# Patient Record
Sex: Female | Born: 1985 | Race: White | Hispanic: No | Marital: Married | Smoking: Never smoker
Health system: Southern US, Community
[De-identification: ages and names within clinical notes are randomized; demographics above are authoritative.]

## PROBLEM LIST (undated history)

## (undated) ENCOUNTER — Inpatient Hospital Stay (HOSPITAL_COMMUNITY): Payer: Self-pay

## (undated) DIAGNOSIS — I10 Essential (primary) hypertension: Secondary | ICD-10-CM

## (undated) DIAGNOSIS — E669 Obesity, unspecified: Secondary | ICD-10-CM

## (undated) HISTORY — PX: TONSILLECTOMY: SHX5217

---

## 2012-04-14 ENCOUNTER — Ambulatory Visit: Payer: Self-pay | Admitting: Obstetrics and Gynecology

## 2013-09-27 IMAGING — US ULTRASOUND RIGHT BREAST
1 series · 8 of 8 positions shown · non-contrast
Comparison: none

REASON FOR EXAM: RT BR LESION 3 OCLOCK
COMMENTS:

[Series 1: ultrasound right breast · 0.09mm/px · 8 of 8 slices shown]
[im 1/8]
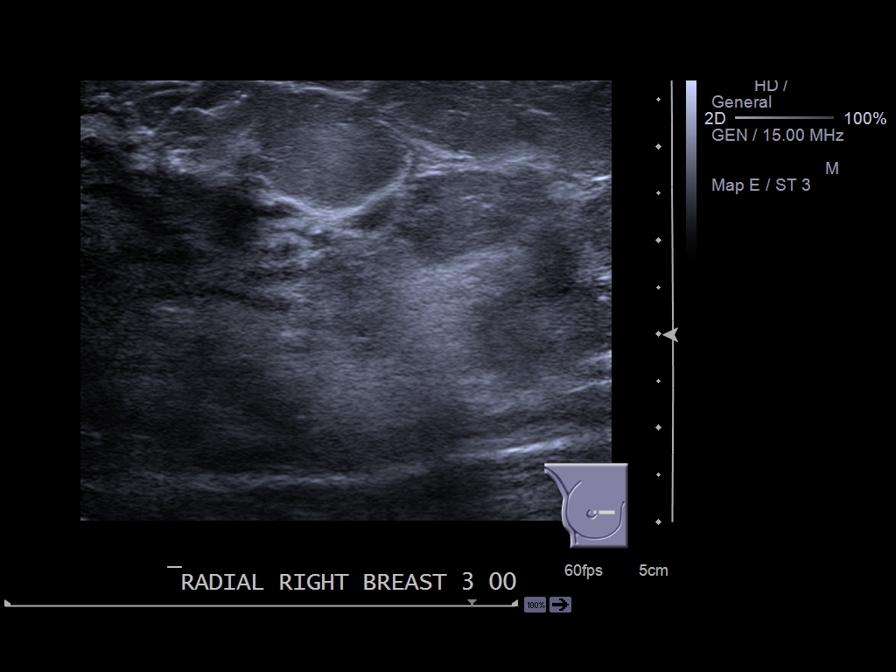
[im 2/8]
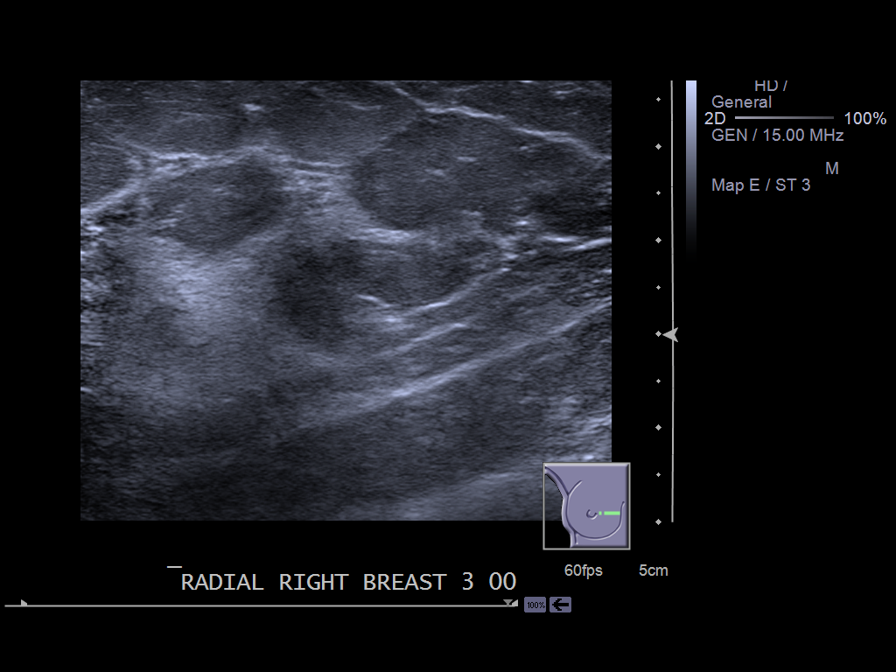
[im 3/8]
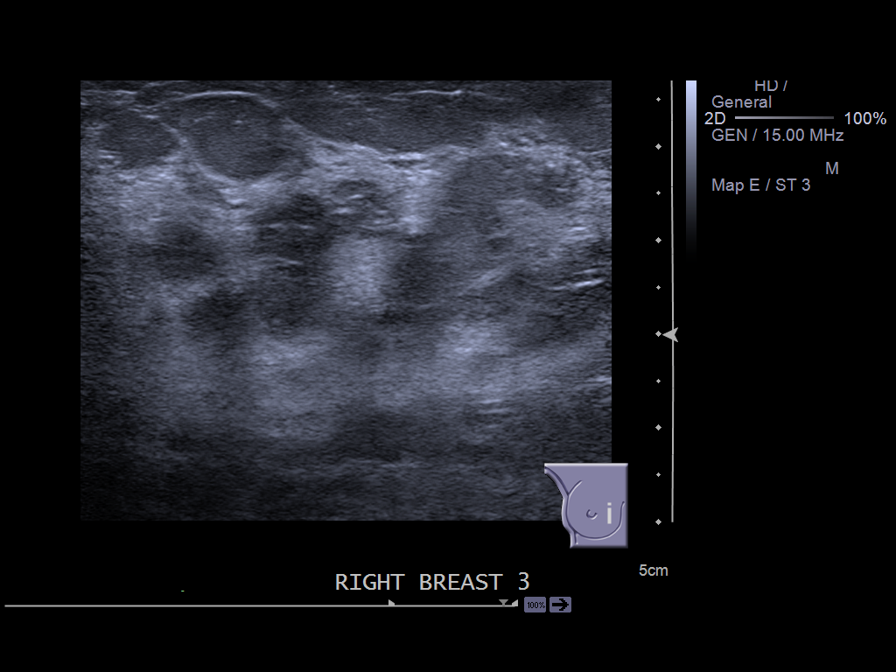
[im 4/8]
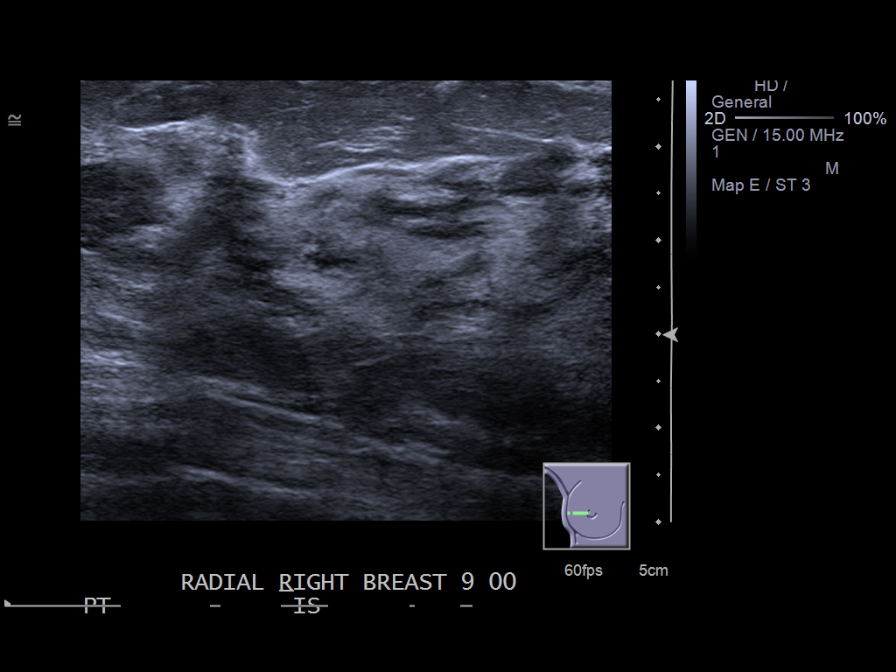
[im 5/8]
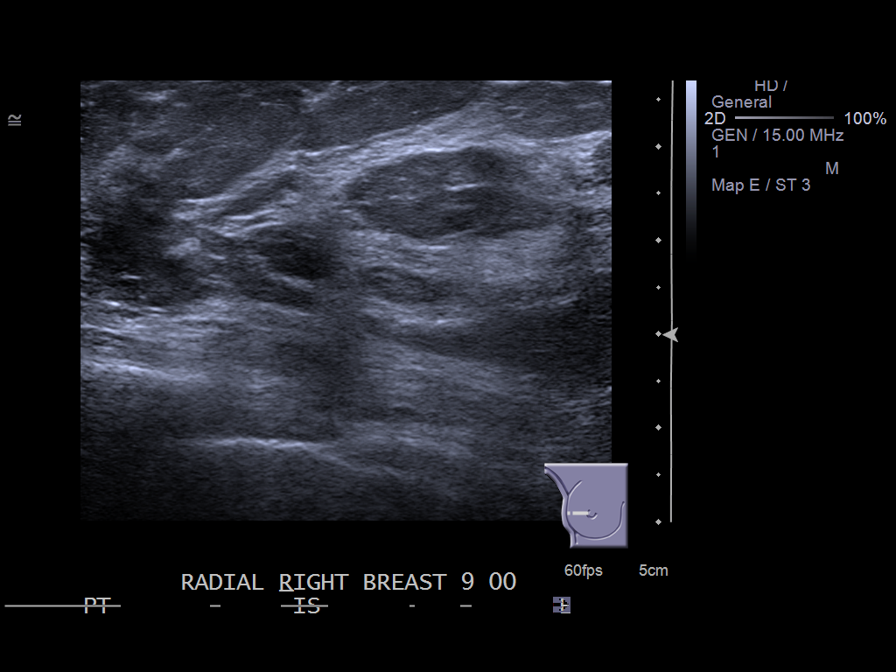
[im 6/8]
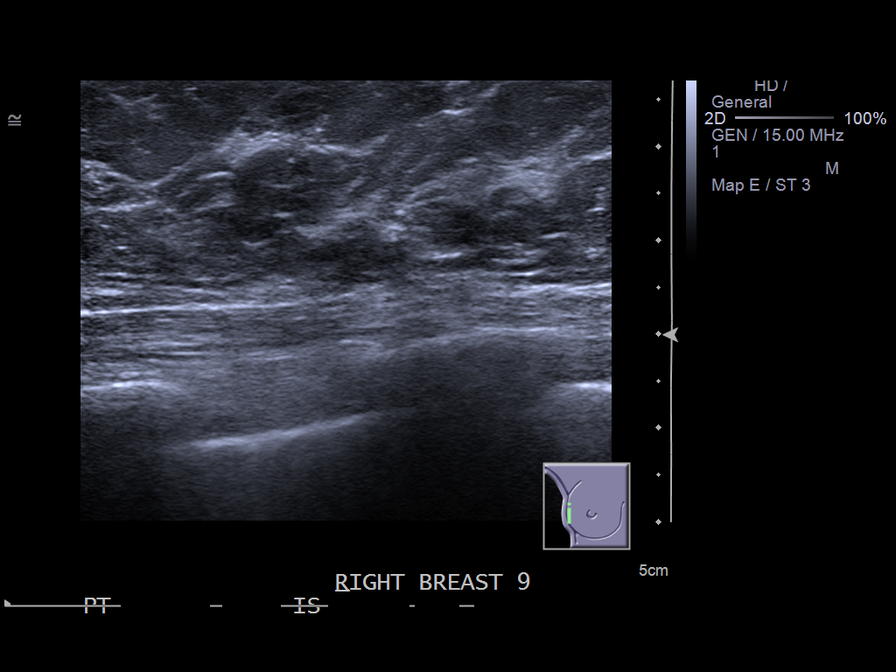
[im 7/8]
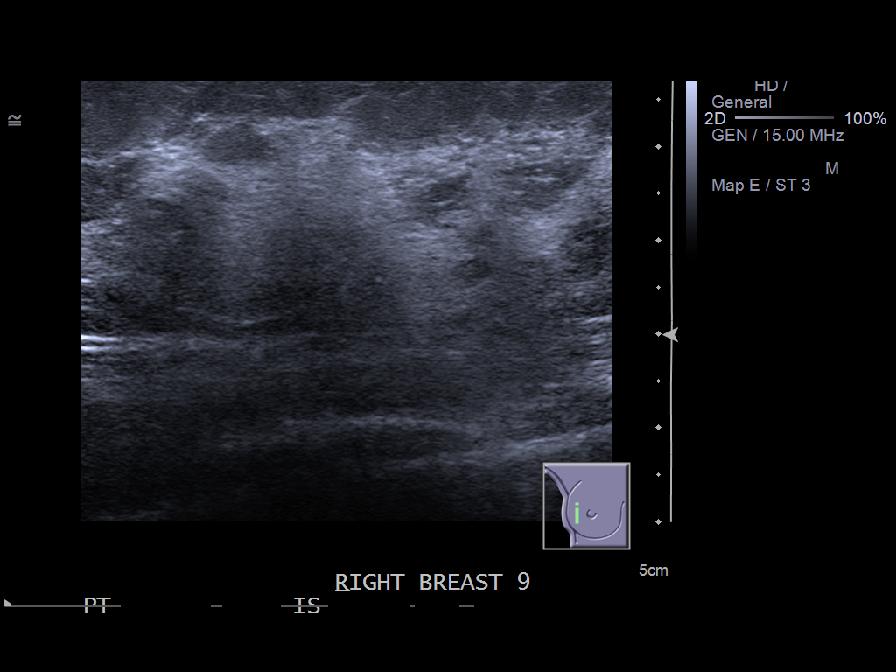
[im 8/8]
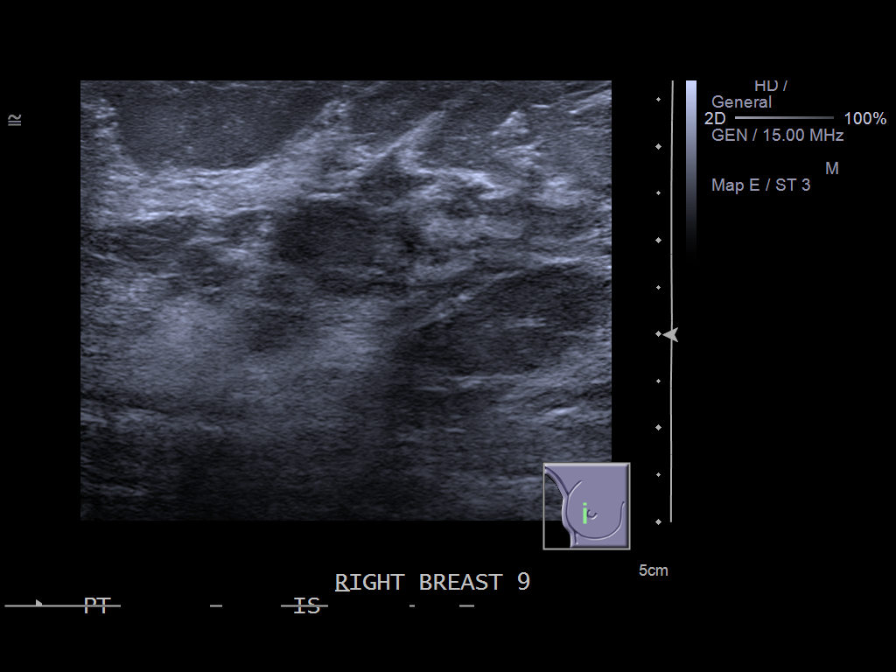

[8 of 8 positions shown; findings below may reference images not displayed]

PROCEDURE:     US  - US BREAST RIGHT  - April 14, 2012 [DATE]

RESULT:     Comparison: None.

Technique and Findings:
Multiple grayscale and color Doppler images were obtained of the right
breast at [DATE] as well as [DATE]. The order stated lesion at [DATE]. However,
the patient's stated the ordering physician described palpable abnormality
at [DATE]. No mass or suspicious shadowing was identified. There is dense
fibroglandular tissue present.
IMPRESSION: BI-RADS 1. Negative.

There is no sonographic evidence of malignancy. Recommend further evaluation
and management of the reported palpable abnormality be based on the grounds.
If this area remains clinically suspicious, surgical consultation would be
recommended.

[REDACTED]

## 2014-10-18 ENCOUNTER — Ambulatory Visit: Payer: Self-pay | Admitting: Physician Assistant

## 2015-12-19 DIAGNOSIS — E669 Obesity, unspecified: Secondary | ICD-10-CM | POA: Insufficient documentation

## 2016-04-01 IMAGING — US US PELV - US TRANSVAGINAL
1 series · 14 of 25 positions shown · non-contrast
Comparison: None

CLINICAL DATA: RIGHT lower quadrant pain. Initial encounter. Acute
onset of pain yesterday.



[Series 1: us pelv - us transvaginal · 0.22mm/px · 14 of 97 slices shown]
[im 1/97]
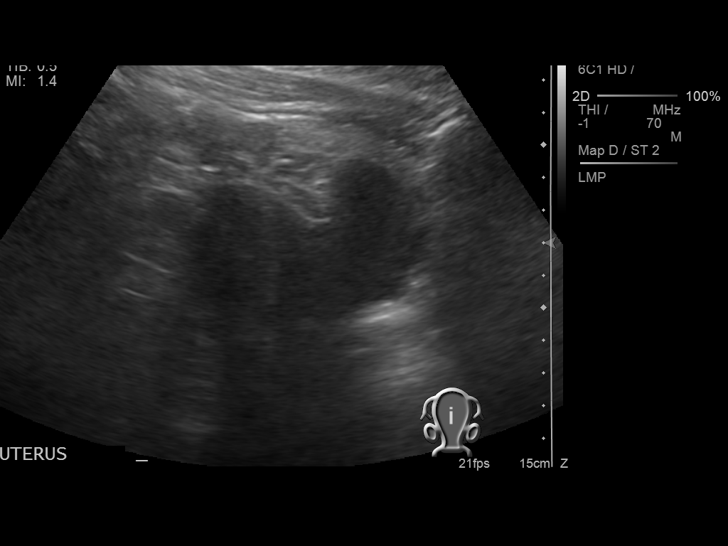
[im 9/97]
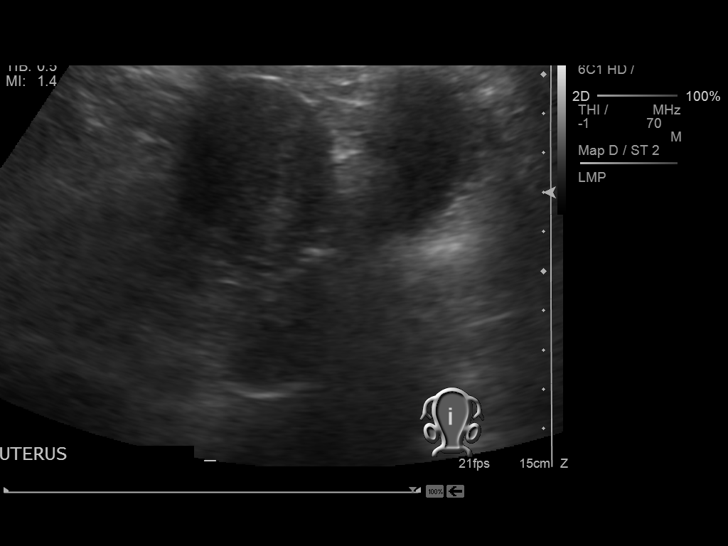
[im 17/97]
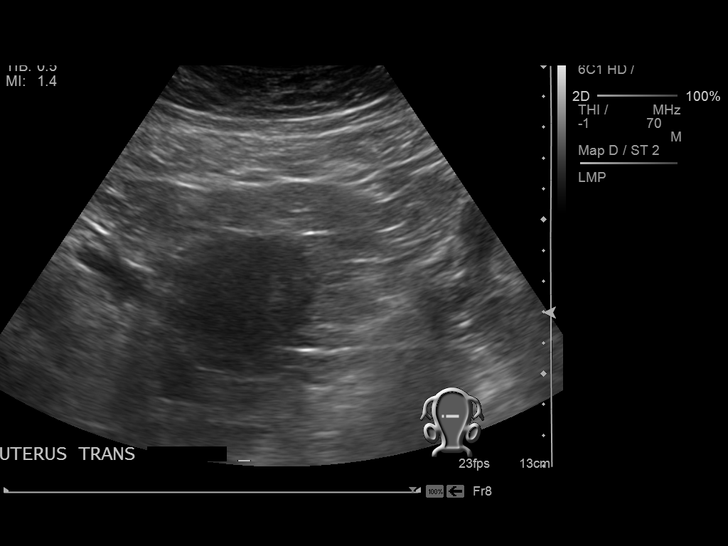
[im 25/97]
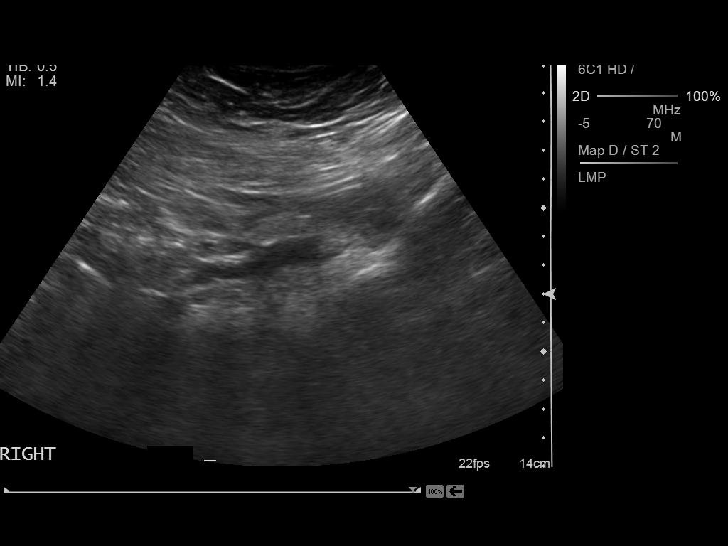
[im 33/97]
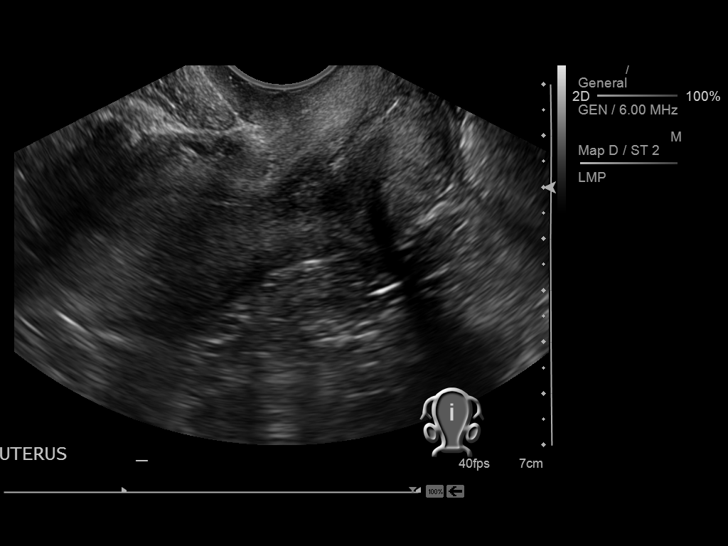
[im 37/97]
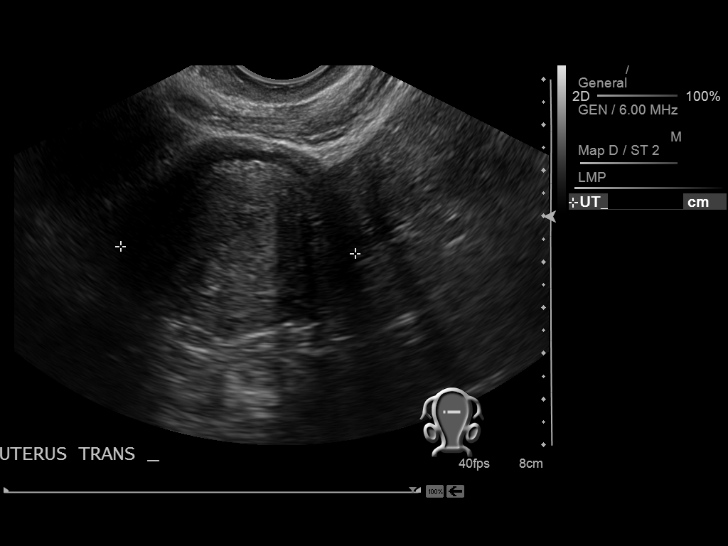
[im 45/97]
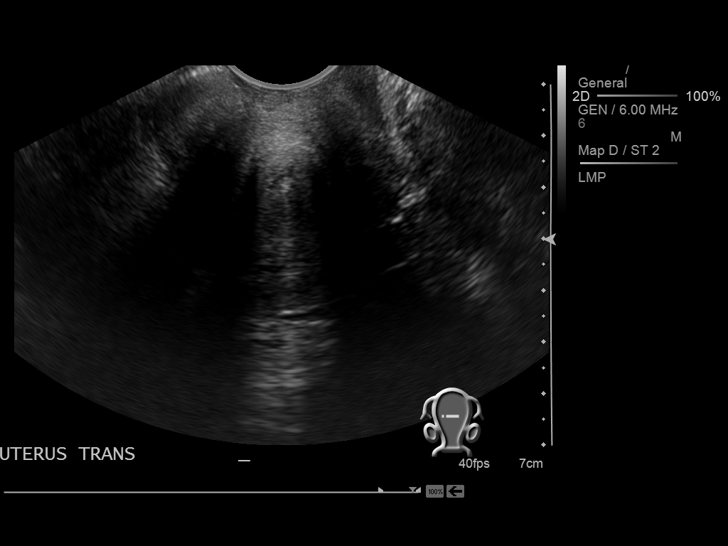
[im 53/97]
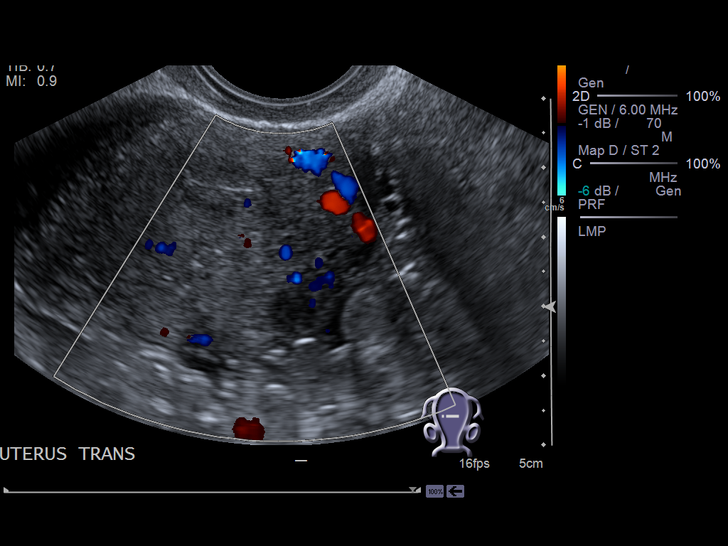
[im 61/97]
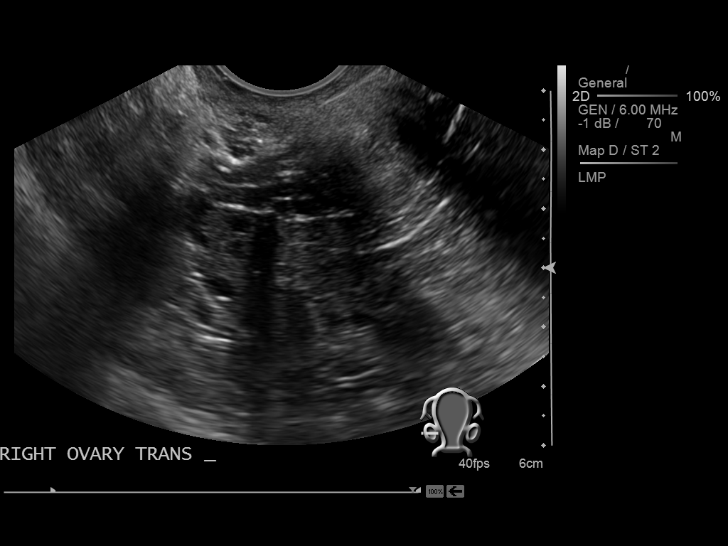
[im 65/97]
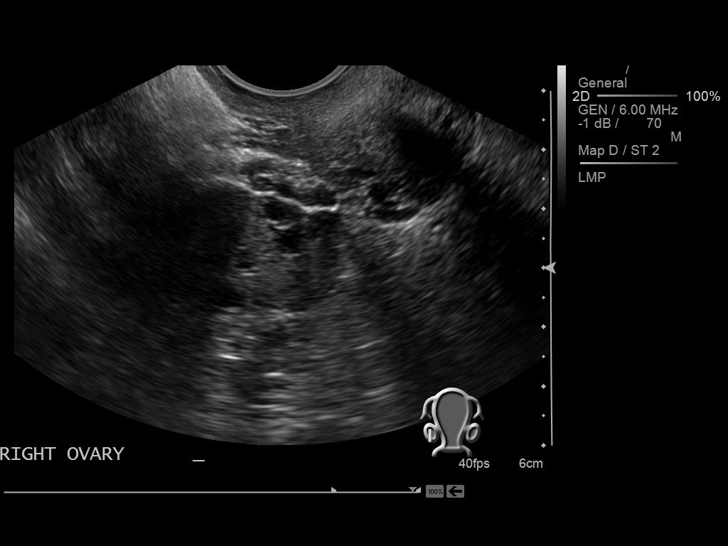
[im 73/97]
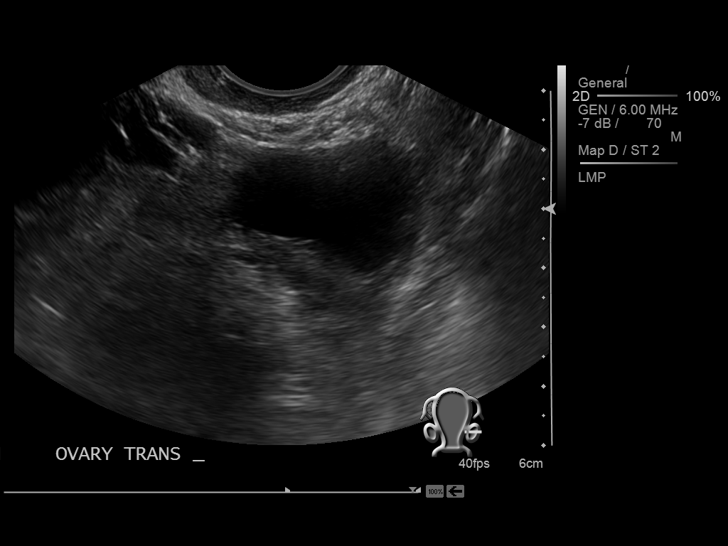
[im 81/97]
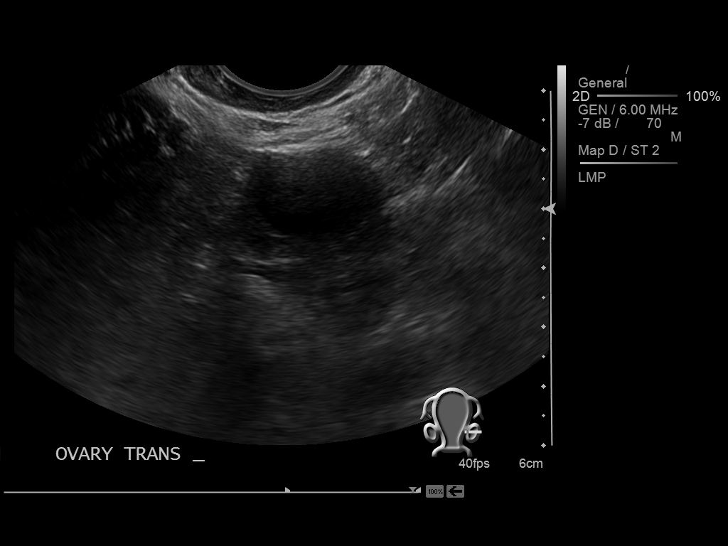
[im 89/97]
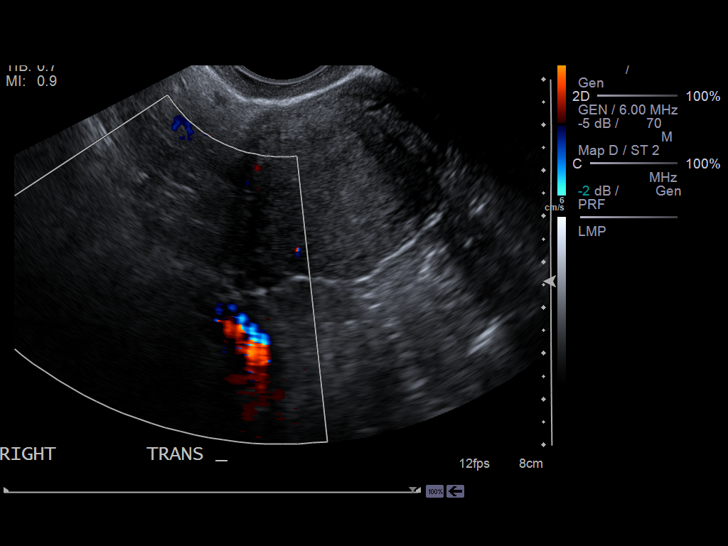
[im 97/97]
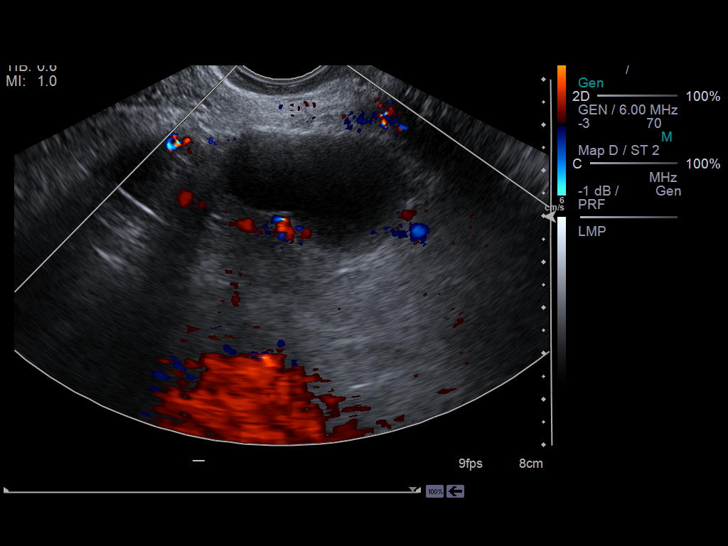

[14 of 25 positions shown; findings below may reference images not displayed]

FINDINGS: Uterus

Measurements: 83 mm x 38 mm x 52 mm. Small anterior fundal fibroid
measuring 8 mm x 6 mm x 8 mm in the LEFT uterine fundus.

Endometrium

Thickness: 3 mm common normal..  No focal abnormality visualized.

Right ovary

Measurements: 33 mm x 15 mm x 23 mm.. Normal appearance/no adnexal
mass.

Left ovary

Measurements: 45 mm x 33 mm x 36 mm.. Normal physiologic appearance.
Simple ovarian cyst measuring 38 mm.

Other findings

Trace free fluid, physiologic.
IMPRESSION: 1. No acute abnormality.
2. Subcentimeter LEFT fundal fibroid.
3. Simple LEFT ovarian cyst.

## 2017-04-28 ENCOUNTER — Encounter: Payer: Self-pay | Admitting: Advanced Practice Midwife

## 2017-04-28 ENCOUNTER — Ambulatory Visit (INDEPENDENT_AMBULATORY_CARE_PROVIDER_SITE_OTHER): Payer: BC Managed Care – PPO | Admitting: Advanced Practice Midwife

## 2017-04-28 VITALS — BP 110/70 | HR 79 | Ht 70.0 in | Wt 262.0 lb

## 2017-04-28 DIAGNOSIS — Z3046 Encounter for surveillance of implantable subdermal contraceptive: Secondary | ICD-10-CM

## 2017-04-28 DIAGNOSIS — Z01419 Encounter for gynecological examination (general) (routine) without abnormal findings: Secondary | ICD-10-CM

## 2017-04-28 DIAGNOSIS — Z0000 Encounter for general adult medical examination without abnormal findings: Secondary | ICD-10-CM

## 2017-04-28 NOTE — Progress Notes (Signed)
Patient ID: Jody Smith, female   DOB: 1986/07/15, 31 y.o.   MRN: [id number]     Gynecology Annual Exam  PCP: Raynelle Bring  Chief Complaint:  Chief Complaint  Patient presents with  . Gynecologic Exam    getting married next month  . Contraception    Nexplanon removal    History of Present Illness: Patient is a 31 y.o. G1P0010 presents for annual exam. The patient has no complaints today. She is requesting removal of her Nexplanon due to desired future pregnancy. Discussion of preconception and fertility. Discussion of her current medications and safety during pregnancy. Recommendations given for alternative therapy to aid sleep.   LMP: Patient's last menstrual period was 04/28/2017. Average Interval: regular, 28 days Duration of flow: 5 days Heavy Menses: yes Clots: no Intermenstrual Bleeding: no Postcoital Bleeding: no Dysmenorrhea: no  The patient is sexually active. She currently uses Nexplanon for contraception. She denies dyspareunia.  The patient does not perform self breast exams.  There is no notable family history of breast or ovarian cancer in her family.  The patient wears seatbelts: yes.   The patient has regular exercise: yes.  She admits to healthy diet and adequate hydration.  The patient denies current symptoms of depression.    Review of Systems: Review of Systems  Constitutional: Negative.   HENT: Negative.   Eyes: Negative.   Respiratory: Negative.   Cardiovascular: Negative.   Gastrointestinal: Negative.   Genitourinary: Negative.   Musculoskeletal: Negative.   Skin: Negative.   Neurological: Negative.   Endo/Heme/Allergies: Negative.   Psychiatric/Behavioral: Negative.     Past Medical History:  History reviewed. No pertinent past medical history.  Past Surgical History:  History reviewed. No pertinent surgical history.  Gynecologic History:  Patient's last menstrual period was 04/28/2017. Contraception: Nexplanon Last Pap:  1 year ago Results were: no abnormalities   Obstetric History: G1P0010  Family History:  History reviewed. No pertinent family history.  Social History:  Social History   Social History  . Marital status: Single    Spouse name: N/A  . Number of children: N/A  . Years of education: N/A   Occupational History  . Not on file.   Social History Main Topics  . Smoking status: Never Smoker  . Smokeless tobacco: Never Used  . Alcohol use Yes     Comment: social  . Drug use: No  . Sexual activity: Yes    Partners: Male    Birth control/ protection: Implant   Other Topics Concern  . Not on file   Social History Narrative  . No narrative on file    Allergies:  No Known Allergies  Medications: Prior to Admission medications   Medication Sig Start Date End Date Taking? Authorizing Provider  clindamycin (CLINDAGEL) 1 % gel Apply topically. 10/07/16 10/07/17 Yes [provider]  topiramate (TOPAMAX) 50 MG tablet Take by mouth. 10/07/16 10/07/17 Yes [provider]  traZODone (DESYREL) 50 MG tablet TAKE 1-2 TABLETS AT BEDTIME AS NEEDED FOR SLEEP 04/18/17  Yes [provider]    Physical Exam Vitals: Blood pressure 110/70, pulse 79, height 5\' 10"  (1.778 m), weight 262 lb (118.8 kg), last menstrual period 04/28/2017.  General: NAD HEENT: normocephalic, anicteric Thyroid: no enlargement, no palpable nodules Pulmonary: No increased work of breathing, CTAB Cardiovascular: RRR, distal pulses 2+ Breast: Breast symmetrical, no tenderness, no palpable nodules or masses, no skin or nipple retraction present, no nipple discharge.  No axillary or supraclavicular lymphadenopathy. Abdomen: NABS,  soft, non-tender, non-distended.  Umbilicus without lesions.  No hepatomegaly, splenomegaly or masses palpable. No evidence of hernia  Genitourinary:  External: Normal external female genitalia.  Normal urethral meatus, normal  Bartholin's and Skene's glands.    Vagina:  Normal vaginal mucosa, no evidence of prolapse.    Cervix: Grossly normal in appearance, no bleeding, no CMT  Uterus: Non-enlarged, mobile, normal contour.    Adnexa: ovaries non-enlarged, no adnexal masses  Rectal: deferred  Lymphatic: no evidence of inguinal lymphadenopathy Extremities: no edema, erythema, or tenderness Neurologic: Grossly intact Psychiatric: mood appropriate, affect full   Assessment: 31 y.o. G1P0010 routine annual exam  Plan: Problem List Items Addressed This Visit    None      1) STI screening was offered and declined  2) ASCCP guidelines and rational discussed.  Patient opts for every 3 years screening interval  3) Contraception - Patient plans for future pregnancy  4) Routine healthcare maintenance including cholesterol, diabetes screening discussed managed by PCP   5) Preconception healthy lifestyle, diet, exercise, folic acid supplement, safe meds  6) Follow up 1 year for routine annual exam  Jody Smith, CNM     GYNECOLOGY PROCEDURE NOTE  Nexplanon removal discussed in detail.  Risks of infection, bleeding, nerve injury all reviewed.  Patient understands risks and desires to proceed.  Verbal consent obtained.  Patient is certain she wants the Nexplanon removed.  All questions answered.  Procedure: Patient placed in dorsal supine with left arm above head, elbow flexed at 90 degrees, arm resting on examination table.  Nexplanon identified without problems.  Betadine scrub x2.  1 ml of 1% lidocaine injected under Nexplanon device without problems.  Sterile gloves applied.  Small 0.5cm incision made at distal tip of Nexplanon device with 11 blade scalpel.  Nexplanon brought to incision and grasped with a small kelly clamp.  Nexplanon removed intact without problems.  Pressure applied to incision.  Hemostasis obtained.  Steri-strips applied, followed by bandage and compression dressing.  Patient tolerated procedure well.  No  complications.   Assessment: 31 y.o. year old female now s/p uncomplicated Nexplanon removal.  Plan: 1.  Patient given post procedure precautions and asked to call for fever, chills, redness or drainage from her incision, bleeding from incision.  She understands she will likely have a small bruise near site of removal and can remove bandage tomorrow and steri-strips in approximately 1 week.  2) Contraception: plans future pregnancy  J2001 for lidocaine block, [id number] for nexplanon removal  Jody MallJane Ramzy Smith, CNM

## 2017-07-23 ENCOUNTER — Encounter: Payer: BC Managed Care – PPO | Admitting: Advanced Practice Midwife

## 2017-08-18 ENCOUNTER — Telehealth: Payer: Self-pay

## 2017-08-18 NOTE — Telephone Encounter (Signed)
U/s tech from [facility] in 4D called after hour nurse on 08/14/17 at 5:22pm stating they could not find the baby and wanted to talk to on call.  After hour nurse connected caller c on call.  I left msg for pt that I was calling to f/u c her about this (there is no documentation in chart).  [phone]

## 2017-08-19 NOTE — Telephone Encounter (Signed)
Yes, I called the patient. Turns out she is a [employer] patient. I advised her to call her provider about the pregnancy. Was suppose to be 9 weeks-had an empty gestational sac.

## 2017-09-20 DIAGNOSIS — I10 Essential (primary) hypertension: Secondary | ICD-10-CM | POA: Insufficient documentation

## 2017-09-20 DIAGNOSIS — G4709 Other insomnia: Secondary | ICD-10-CM | POA: Insufficient documentation

## 2018-08-08 ENCOUNTER — Other Ambulatory Visit: Payer: Self-pay | Admitting: Internal Medicine

## 2018-08-08 DIAGNOSIS — R112 Nausea with vomiting, unspecified: Secondary | ICD-10-CM

## 2018-08-08 DIAGNOSIS — R1084 Generalized abdominal pain: Secondary | ICD-10-CM

## 2018-08-10 ENCOUNTER — Ambulatory Visit: Payer: Self-pay

## 2018-08-11 ENCOUNTER — Ambulatory Visit
Admission: RE | Admit: 2018-08-11 | Discharge: 2018-08-11 | Disposition: A | Discharge status: Home / Self Care | Payer: BC Managed Care – PPO | Source: Ambulatory Visit | Attending: Internal Medicine | Admitting: Internal Medicine

## 2018-08-11 ENCOUNTER — Other Ambulatory Visit
Admission: RE | Admit: 2018-08-11 | Discharge: 2018-08-11 | Disposition: A | Discharge status: Home / Self Care | Payer: BC Managed Care – PPO | Source: Ambulatory Visit | Attending: Internal Medicine | Admitting: Internal Medicine

## 2018-08-11 DIAGNOSIS — R1084 Generalized abdominal pain: Secondary | ICD-10-CM | POA: Diagnosis present

## 2018-08-11 DIAGNOSIS — R112 Nausea with vomiting, unspecified: Secondary | ICD-10-CM | POA: Insufficient documentation

## 2018-08-11 DIAGNOSIS — K591 Functional diarrhea: Secondary | ICD-10-CM | POA: Insufficient documentation

## 2018-08-11 LAB — GASTROINTESTINAL PANEL BY PCR, STOOL (REPLACES STOOL CULTURE)
Adenovirus F40/41: NOT DETECTED
Astrovirus: NOT DETECTED
CRYPTOSPORIDIUM: NOT DETECTED
Campylobacter species: NOT DETECTED
Cyclospora cayetanensis: NOT DETECTED
ENTEROPATHOGENIC E COLI (EPEC): NOT DETECTED
Entamoeba histolytica: NOT DETECTED
Enteroaggregative E coli (EAEC): NOT DETECTED
Enterotoxigenic E coli (ETEC): NOT DETECTED
Giardia lamblia: NOT DETECTED
Norovirus GI/GII: NOT DETECTED
Plesimonas shigelloides: NOT DETECTED
ROTAVIRUS A: NOT DETECTED
Salmonella species: NOT DETECTED
Sapovirus (I, II, IV, and V): NOT DETECTED
Shiga like toxin producing E coli (STEC): NOT DETECTED
Shigella/Enteroinvasive E coli (EIEC): NOT DETECTED
Vibrio cholerae: NOT DETECTED
Vibrio species: NOT DETECTED
YERSINIA ENTEROCOLITICA: NOT DETECTED

## 2018-08-11 LAB — C DIFFICILE QUICK SCREEN W PCR REFLEX
C Diff antigen: NEGATIVE
C Diff interpretation: NOT DETECTED
C Diff toxin: NEGATIVE

## 2018-08-12 LAB — CALPROTECTIN, FECAL: CALPROTECTIN, FECAL: 30 ug/g (ref 0–120)

## 2018-09-07 ENCOUNTER — Ambulatory Visit: Payer: BC Managed Care – PPO | Admitting: Dietician

## 2018-09-12 ENCOUNTER — Encounter: Payer: Self-pay | Admitting: Dietician

## 2018-09-12 ENCOUNTER — Encounter: Payer: BC Managed Care – PPO | Attending: Internal Medicine | Admitting: Dietician

## 2018-09-12 VITALS — Ht 70.0 in | Wt 304.8 lb

## 2018-09-12 DIAGNOSIS — I10 Essential (primary) hypertension: Secondary | ICD-10-CM | POA: Diagnosis not present

## 2018-09-12 DIAGNOSIS — Z6841 Body Mass Index (BMI) 40.0 and over, adult: Secondary | ICD-10-CM

## 2018-09-12 NOTE — Progress Notes (Signed)
Medical Nutrition Therapy: Visit start time: 1445  end time: 1550  Assessment:  Diagnosis: obesity, HTN Past medical history: GI reflux, insomnia Psychosocial issues/ stress concerns: patient reports moderately-high stress level; works as Editor, commissioning full time.   Preferred learning method:  . Hands-on   Current weight: 304.8lbs Height: 5'10" Medications, supplements: reconciled list in medical record  Progress and evaluation: Patient reports significant weight gain over the past year; had miscarriage at about [redacted] weeks gestation last December followed by depression which led to overeating. She wants to lose weight to improve fertility as well as back pain. Tried keto diet and liked it but has regained weight, has now started on a low calorie plan and has lost about 12 lbs so far. States she is a picky eater, does not like some vegetables such as tomatoes, peppers, carrots, and most fruits other than apples, oranges and pineapple.    Physical activity: none at this time  Dietary Intake:  Usual eating pattern includes 3 meals and 1-2 snacks per day. Dining out frequency: 3-4 meals per week.  Breakfast: 2pkg oatmeal or granola and coffee Snack: none Lunch: 6-in sub or wrap sandwich Snack: sometimes chips, usually none when at work  Supper:5:30-6:30-- larger meal -- 1/19 brown rice with shrimp, sugar free sauce; 1/20 air-fried chicken wings; does not like spicy foods.  Snack: 9-10pm sweets or chips Beverages: trying to increase water (doesn't like water, forces self to drink), flavored waters, has stopped sodas including diet, and fruit drinks/ juices  Nutrition Care Education: Topics covered: weight control, hypertension Basic nutrition: basic food groups, appropriate nutrient balance, appropriate meal and snack schedule, general nutrition guidelines    Weight control: benefits of weight control, healthy (ie Mediterranean) vs unhealthy (ie keto) diet patterns, choosing low fat  and low sugar foods, portion control, role of physical activity, benefits of tracking food intake Hypertension: goal for sodium intake and food label reading for sodium; encouraged increased vegetable and fruit intake, and exercise   Nutritional Diagnosis:  [place]-2.1 Inpaired nutrition utilization As related to hypertension.  As evidenced by MD diagnosis and patient report. [place]-3.3 Overweight/obesity As related to excess calories and inactivity.  As evidenced by patient with current BMI of 43.7, and patient report of diet history and physical activity.  Intervention:   Patient has made some significant diet changes for weight loss and is motivated to continue.  Completed instruction as noted above.  Set goals for further diet modification, with direction from patient.  Education Materials given:  . Plate Planner with food lists . Sample meal pattern/ menus . Mediterranean Diet ([place]) . Goals/ instructions   Learner/ who was taught:  . Patient   Level of understanding: Marland Kitchen Verbalizes/ demonstrates competency  Demonstrated degree of understanding via:   Teach back Learning barriers: . None   Willingness to learn/ readiness for change: . Eager, change in progress   Monitoring and Evaluation:  Dietary intake, exercise, HTN, and body weight      follow up: 10/27/18

## 2018-09-12 NOTE — Patient Instructions (Signed)
   Keep up healthy changes by limiting regular juices and sodas, lean proteins.   OK to choose some healthy frozen meals, ideally keep sodium to about 600mg  average.   Use smaller portions of butter and other fats.   Start some regular light exercise, increase as strength and stamina increases.   Control portions of starchy foods and eat generous portions of low-carb veggies.

## 2018-10-12 ENCOUNTER — Inpatient Hospital Stay (HOSPITAL_COMMUNITY)
Admission: AD | Admit: 2018-10-12 | Discharge: 2018-10-12 | Disposition: A | Discharge status: Home / Self Care | Payer: BC Managed Care – PPO | Attending: Obstetrics & Gynecology | Admitting: Obstetrics & Gynecology

## 2018-10-12 ENCOUNTER — Encounter (HOSPITAL_COMMUNITY): Payer: Self-pay | Admitting: *Deleted

## 2018-10-12 ENCOUNTER — Other Ambulatory Visit: Payer: Self-pay

## 2018-10-12 DIAGNOSIS — O00109 Unspecified tubal pregnancy without intrauterine pregnancy: Secondary | ICD-10-CM | POA: Insufficient documentation

## 2018-10-12 LAB — COMPREHENSIVE METABOLIC PANEL
ALT: 14 U/L (ref 0–44)
AST: 13 U/L — ABNORMAL LOW (ref 15–41)
Albumin: 4 g/dL (ref 3.5–5.0)
Alkaline Phosphatase: 61 U/L (ref 38–126)
Anion gap: 10 (ref 5–15)
BUN: 11 mg/dL (ref 6–20)
CO2: 23 mmol/L (ref 22–32)
CREATININE: 0.78 mg/dL (ref 0.44–1.00)
Calcium: 8.6 mg/dL — ABNORMAL LOW (ref 8.9–10.3)
Chloride: 102 mmol/L (ref 98–111)
GFR calc non Af Amer: >60 mL/min (ref >60)
Glucose, Bld: 81 mg/dL (ref 70–99)
Potassium: 3.7 mmol/L (ref 3.5–5.1)
SODIUM: 135 mmol/L (ref 135–145)
Total Bilirubin: 0.5 mg/dL (ref 0.3–1.2)
Total Protein: 7.8 g/dL (ref 6.5–8.1)

## 2018-10-12 LAB — CBC WITH DIFFERENTIAL/PLATELET
Basophils Absolute: 0 10*3/uL (ref 0.0–0.1)
Basophils Relative: 0 %
EOS ABS: 0 10*3/uL (ref 0.0–0.5)
EOS PCT: 0 %
HCT: 37.9 % (ref 36.0–46.0)
HEMOGLOBIN: 12.3 g/dL (ref 12.0–15.0)
Lymphocytes Relative: 24 %
Lymphs Abs: 2.1 10*3/uL (ref 0.7–4.0)
MCH: 28.9 pg (ref 26.0–34.0)
MCHC: 32.5 g/dL (ref 30.0–36.0)
MCV: 89 fL (ref 80.0–100.0)
Monocytes Absolute: 0.3 10*3/uL (ref 0.1–1.0)
Monocytes Relative: 4 %
Neutro Abs: 6.3 10*3/uL (ref 1.7–7.7)
Neutrophils Relative %: 72 %
Platelets: 306 10*3/uL (ref 150–400)
RBC: 4.26 MIL/uL (ref 3.87–5.11)
RDW: 14.6 % (ref 11.5–15.5)
WBC: 8.8 10*3/uL (ref 4.0–10.5)
nRBC: 0 % (ref 0.0–0.2)

## 2018-10-12 LAB — HCG, QUANTITATIVE, PREGNANCY: hCG, Beta Chain, Quant, S: 61 m[IU]/mL — ABNORMAL HIGH (ref <5)

## 2018-10-12 MED ORDER — METHOTREXATE INJECTION FOR WOMEN'S HOSPITAL
50.0000 mg/m2 | Freq: Once | INTRAMUSCULAR | Status: AC
  Administered 2018-10-12: 135 mg via INTRAMUSCULAR
  Filled 2018-10-12: qty 2.7

## 2018-10-12 NOTE — MAU Note (Signed)
Pt was in office of Monday.  Was told ? Failed or ectopic preg.  Was sent over here today for a "chemotherapy shot in her arm". States had blood work and a biopsy on Monday.  Has had light bleeding since then, cramping.

## 2018-10-12 NOTE — Progress Notes (Signed)
Pt sent to MAU from office with abn rise in quant, 52 on 2/17 (we had 4 quants, all abn rise) and office EBX done on 2/17 failed to show villi/ IUP, so she is sent over for Methotrexate inj for presumed ectopic pregnancy.  Patient was counseled on 2/17 in office and plan was made pending path report.  D4 quant in hosp lab visit 2/22 and D7 quant in office on 2/25  V.Juliene Pina, MD

## 2018-10-12 NOTE — MAU Note (Signed)
Dr. Juliene Pina called to let the RN know it was OK to give Methotrexate.

## 2018-10-12 NOTE — MAU Note (Signed)
Pharmacy called to proceed with prepping Methotrexate

## 2018-10-15 ENCOUNTER — Other Ambulatory Visit (HOSPITAL_COMMUNITY)
Admission: RE | Admit: 2018-10-15 | Discharge: 2018-10-15 | Disposition: A | Discharge status: Home / Self Care | Payer: BC Managed Care – PPO | Source: Ambulatory Visit | Attending: Obstetrics & Gynecology | Admitting: Obstetrics & Gynecology

## 2018-10-15 DIAGNOSIS — O009 Unspecified ectopic pregnancy without intrauterine pregnancy: Secondary | ICD-10-CM | POA: Insufficient documentation

## 2018-10-15 LAB — HCG, QUANTITATIVE, PREGNANCY: hCG, Beta Chain, Quant, S: 83 m[IU]/mL — ABNORMAL HIGH (ref <5)

## 2018-10-27 ENCOUNTER — Ambulatory Visit: Payer: BC Managed Care – PPO | Admitting: Dietician

## 2018-10-28 ENCOUNTER — Encounter: Payer: Self-pay | Admitting: Dietician

## 2018-10-28 NOTE — Progress Notes (Addendum)
Patient rescheduled her appointment from 10/27/18 to 12/01/18.

## 2018-11-30 ENCOUNTER — Encounter: Payer: Self-pay | Admitting: Dietician

## 2018-11-30 NOTE — Progress Notes (Signed)
Patient rescheduled her appointment from 12/01/18 to 01/12/19.

## 2018-12-01 ENCOUNTER — Ambulatory Visit: Payer: BC Managed Care – PPO | Admitting: Dietician

## 2019-01-12 ENCOUNTER — Encounter: Payer: BC Managed Care – PPO | Attending: Internal Medicine | Admitting: Dietician

## 2019-01-12 ENCOUNTER — Other Ambulatory Visit: Payer: Self-pay

## 2019-01-12 VITALS — Ht 70.0 in | Wt 304.8 lb

## 2019-01-12 DIAGNOSIS — I10 Essential (primary) hypertension: Secondary | ICD-10-CM | POA: Diagnosis not present

## 2019-01-12 DIAGNOSIS — Z6841 Body Mass Index (BMI) 40.0 and over, adult: Secondary | ICD-10-CM | POA: Diagnosis not present

## 2019-01-12 NOTE — Patient Instructions (Signed)
   Make some lean protein choices and make sure to include plenty of low-carb veggies with meals (contain potassium and fiber, among other vitamins and minerals)  OK to have 1 protein shake daily as a meal replacement.   Start some light exercise as able.

## 2019-01-12 NOTE — Progress Notes (Signed)
Medical Nutrition Therapy: Visit start time: 1700  end time: 1730  Assessment:  Diagnosis: obesity Medical history changes: recent miscarriage Psychosocial issues/ stress concerns: none  Current weight: 304.8lbs Height: 5'10" Medications, supplement changes: reconciled list in medical record  Progress and evaluation:   Patient reports onset of pregnancy in February but had miscarriage in March.   She stopped healthy diet habits for a time afterwards, with subsequent weight increase. She reports highest weight was about 315lbs.   Mediterranean style diet did not suit her well due to promoting fish intake.   Patient has begun following keto diet for several weeks now and is feeling better, losing some weight, and is now back down to same weight at initial visit on 09/12/18. She would like to continue to follow the keto or similar plan as long as it won't decrease chances of healthy pregnancy, as she will be able to try for pregnancy again in 2 months.   Physical activity: none at this time  Dietary Intake:  Usual eating pattern includes 3 meals and 0 snacks (weekdays), 2 snacks per day on weekends. Dining out frequency: not assessed today.  Breakfast: 4 pcs bacon or 2pcs sausage + egg; omelet; Atkins shake Snack: none Lunch: 2 grilled pork chops or other meat Snack: peanuts; pickles; meatballs; shrimp cocktail Supper: steak + broccoli or green beans; or 2 crab cakes + veg; "fried" chicken wrap with crushed pork rinds as crust Snack: same as pm Beverages: some water, mostly crystal light flavored water  Nutrition Care Education: Topics covered: weight control    Weight control: keto diet and advantages/ disadvantages -- advised modified version with leaner proteins and inclusion of low-carb vegetables; discussed need for fiber, potassium (for BP control) and other vitamins and minerals; recommended inclusion of other food groups in longer term; discussed taking multivitamin or prenatal  vitamins; discussed role of and benefits of physical activity.   Nutritional Diagnosis:  [place]-3.3 Overweight/obesity As related to history of excess calories and inactivity.  As evidenced by patient with current BMI of 43.73.  Intervention:   Instruction and discussion as noted above.  Patient will plan to follow modified version of keto diet  Established goals with direction from patient.  Education Materials given:  . (bariatric) pre-op diet guidelines . Goals/ instructions  Learner/ who was taught:  . Patient   Level of understanding: Marland Kitchen Verbalizes/ demonstrates competency   Demonstrated degree of understanding via:   Teach back Learning barriers: . None  Willingness to learn/ readiness for change: . Eager, change in progress  Monitoring and Evaluation:  Dietary intake, exercise, and body weight      follow up: 03/27/19

## 2019-03-27 ENCOUNTER — Ambulatory Visit: Payer: BC Managed Care – PPO | Admitting: Dietician

## 2019-04-28 ENCOUNTER — Encounter: Payer: Self-pay | Admitting: Dietician

## 2019-04-28 NOTE — Progress Notes (Signed)
Patient did not come for her scheduled follow-up visit on 03/27/19. Will contact patient to reschedule.

## 2021-08-08 ENCOUNTER — Other Ambulatory Visit: Payer: Self-pay

## 2021-08-08 ENCOUNTER — Emergency Department: Payer: BC Managed Care – PPO

## 2021-08-08 ENCOUNTER — Encounter: Payer: Self-pay | Admitting: Emergency Medicine

## 2021-08-08 ENCOUNTER — Emergency Department
Admission: EM | Admit: 2021-08-08 | Discharge: 2021-08-08 | Disposition: A | Discharge status: Home / Self Care | Payer: BC Managed Care – PPO | Attending: Emergency Medicine | Admitting: Emergency Medicine

## 2021-08-08 DIAGNOSIS — R791 Abnormal coagulation profile: Secondary | ICD-10-CM | POA: Insufficient documentation

## 2021-08-08 DIAGNOSIS — Z28310 Unvaccinated for covid-19: Secondary | ICD-10-CM | POA: Diagnosis not present

## 2021-08-08 DIAGNOSIS — I10 Essential (primary) hypertension: Secondary | ICD-10-CM | POA: Insufficient documentation

## 2021-08-08 DIAGNOSIS — R112 Nausea with vomiting, unspecified: Secondary | ICD-10-CM

## 2021-08-08 DIAGNOSIS — U071 COVID-19: Secondary | ICD-10-CM | POA: Diagnosis not present

## 2021-08-08 DIAGNOSIS — R509 Fever, unspecified: Secondary | ICD-10-CM | POA: Diagnosis present

## 2021-08-08 DIAGNOSIS — Z79899 Other long term (current) drug therapy: Secondary | ICD-10-CM | POA: Diagnosis not present

## 2021-08-08 HISTORY — DX: Essential (primary) hypertension: I10

## 2021-08-08 HISTORY — DX: Obesity, unspecified: E66.9

## 2021-08-08 LAB — CBC WITH DIFFERENTIAL/PLATELET
Abs Immature Granulocytes: 0.02 10*3/uL (ref 0.00–0.07)
Basophils Absolute: 0 10*3/uL (ref 0.0–0.1)
Basophils Relative: 1 %
Eosinophils Absolute: 0 10*3/uL (ref 0.0–0.5)
Eosinophils Relative: 1 %
HCT: 35.1 % — ABNORMAL LOW (ref 36.0–46.0)
Hemoglobin: 11.3 g/dL — ABNORMAL LOW (ref 12.0–15.0)
Immature Granulocytes: 0 %
Lymphocytes Relative: 3 %
Lymphs Abs: 0.2 10*3/uL — ABNORMAL LOW (ref 0.7–4.0)
MCH: 28.5 pg (ref 26.0–34.0)
MCHC: 32.2 g/dL (ref 30.0–36.0)
MCV: 88.6 fL (ref 80.0–100.0)
Monocytes Absolute: 0.4 10*3/uL (ref 0.1–1.0)
Monocytes Relative: 7 %
Neutro Abs: 5.2 10*3/uL (ref 1.7–7.7)
Neutrophils Relative %: 88 %
Platelets: 219 10*3/uL (ref 150–400)
RBC: 3.96 MIL/uL (ref 3.87–5.11)
RDW: 14.7 % (ref 11.5–15.5)
WBC: 5.9 10*3/uL (ref 4.0–10.5)
nRBC: 0 % (ref 0.0–0.2)

## 2021-08-08 LAB — COMPREHENSIVE METABOLIC PANEL
ALT: 18 U/L (ref 0–44)
AST: 22 U/L (ref 15–41)
Albumin: 4.1 g/dL (ref 3.5–5.0)
Alkaline Phosphatase: 53 U/L (ref 38–126)
Anion gap: 6 (ref 5–15)
BUN: 12 mg/dL (ref 6–20)
CO2: 26 mmol/L (ref 22–32)
Calcium: 8.9 mg/dL (ref 8.9–10.3)
Chloride: 102 mmol/L (ref 98–111)
Creatinine, Ser: 0.9 mg/dL (ref 0.44–1.00)
GFR, Estimated: >60 mL/min (ref >60)
Glucose, Bld: 119 mg/dL — ABNORMAL HIGH (ref 70–99)
Potassium: 3.7 mmol/L (ref 3.5–5.1)
Sodium: 134 mmol/L — ABNORMAL LOW (ref 135–145)
Total Bilirubin: 0.3 mg/dL (ref 0.3–1.2)
Total Protein: 7.4 g/dL (ref 6.5–8.1)

## 2021-08-08 LAB — RESP PANEL BY RT-PCR (FLU A&B, COVID) ARPGX2
Influenza A by PCR: NEGATIVE
Influenza B by PCR: NEGATIVE
SARS Coronavirus 2 by RT PCR: POSITIVE — AB

## 2021-08-08 LAB — URINALYSIS, COMPLETE (UACMP) WITH MICROSCOPIC
Bacteria, UA: NONE SEEN
Bilirubin Urine: NEGATIVE
Glucose, UA: NEGATIVE mg/dL
Hgb urine dipstick: NEGATIVE
Ketones, ur: NEGATIVE mg/dL
Leukocytes,Ua: NEGATIVE
Nitrite: NEGATIVE
Protein, ur: NEGATIVE mg/dL
Specific Gravity, Urine: 1.02 (ref 1.005–1.030)
WBC, UA: NONE SEEN WBC/hpf (ref 0–5)
pH: 7.5 (ref 5.0–8.0)

## 2021-08-08 LAB — LACTIC ACID, PLASMA: Lactic Acid, Venous: 1.9 mmol/L (ref 0.5–1.9)

## 2021-08-08 LAB — PROTIME-INR
INR: 1 (ref 0.8–1.2)
Prothrombin Time: 13.6 seconds (ref 11.4–15.2)

## 2021-08-08 LAB — APTT: aPTT: 30 seconds (ref 24–36)

## 2021-08-08 LAB — PREGNANCY, URINE: Preg Test, Ur: NEGATIVE

## 2021-08-08 MED ORDER — HYDROCOD POLST-CPM POLST ER 10-8 MG/5ML PO SUER: 5.0000 mL | Freq: Two times a day (BID) | ORAL | 0 refills | Status: DC | PRN

## 2021-08-08 MED ORDER — DIPHENHYDRAMINE HCL 50 MG/ML IJ SOLN
25.0000 mg | Freq: Once | INTRAMUSCULAR | Status: DC
  Filled 2021-08-08: qty 1

## 2021-08-08 MED ORDER — ONDANSETRON 4 MG PO TBDP: ORAL_TABLET | ORAL | 0 refills | Status: DC

## 2021-08-08 MED ORDER — IBUPROFEN 600 MG PO TABS
600.0000 mg | ORAL_TABLET | Freq: Once | ORAL | Status: AC
  Administered 2021-08-08: 600 mg via ORAL
  Filled 2021-08-08: qty 1

## 2021-08-08 MED ORDER — ONDANSETRON HCL 4 MG/2ML IJ SOLN
4.0000 mg | INTRAMUSCULAR | Status: AC
  Administered 2021-08-08: 4 mg via INTRAVENOUS
  Filled 2021-08-08: qty 2

## 2021-08-08 MED ORDER — ACETAMINOPHEN 500 MG PO TABS
1000.0000 mg | ORAL_TABLET | Freq: Once | ORAL | Status: AC
  Administered 2021-08-08: 1000 mg via ORAL
  Filled 2021-08-08: qty 2

## 2021-08-08 MED ORDER — DROPERIDOL 2.5 MG/ML IJ SOLN
2.5000 mg | Freq: Once | INTRAMUSCULAR | Status: AC
  Administered 2021-08-08: 2.5 mg via INTRAVENOUS
  Filled 2021-08-08: qty 2

## 2021-08-08 MED ORDER — LACTATED RINGERS IV BOLUS (SEPSIS)
1000.0000 mL | Freq: Once | INTRAVENOUS | Status: AC
  Administered 2021-08-08: 1000 mL via INTRAVENOUS

## 2021-08-08 MED ORDER — DIPHENHYDRAMINE HCL 50 MG/ML IJ SOLN
50.0000 mg | Freq: Once | INTRAMUSCULAR | Status: AC
  Administered 2021-08-08: 50 mg via INTRAVENOUS

## 2021-08-08 MED ORDER — LACTATED RINGERS IV BOLUS: 1000.0000 mL | Freq: Once | INTRAVENOUS | Status: DC

## 2021-08-08 NOTE — ED Provider Notes (Signed)
[facility] Emergency Department Provider Note  ____________________________________________   Event Date/Time   First MD Initiated Contact with Patient 08/08/21 0404     (approximate)  I have reviewed the triage vital signs and the nursing notes.   HISTORY  Chief Complaint Fever    HPI Jody Smith is a 35 y.o. female who presents for evaluation of fever, body aches, intermittent chills, nausea, occasional vomiting, and cough.  Symptoms started about 3 to 4 days ago and have been gradually worsening.  They are now severe.  Nothing particular makes them better or worse.  She is mostly vomiting when she coughs too hard but sometimes she also feels nauseated and vomits separately from the cough.  She reports that she has not been vaccinated against COVID-19 and has not had the illness previously.  She denies any chronic medical conditions.     Past Medical History:  Diagnosis Date   Hypertension    Obesity     Patient Active Problem List   Diagnosis Date Noted   Mild obesity 12/19/2015    History reviewed. No pertinent surgical history.  Prior to Admission medications   Medication Sig Start Date End Date Taking? Authorizing Provider  chlorpheniramine-HYDROcodone (TUSSIONEX PENNKINETIC ER) 10-8 MG/5ML SUER Take 5 mLs by mouth every 12 (twelve) hours as needed for cough. 08/08/21  Yes Loleta Rose, MD  ondansetron (ZOFRAN-ODT) 4 MG disintegrating tablet Allow 1-2 tablets to dissolve in your mouth every 8 hours as needed for nausea/vomiting 08/08/21  Yes Loleta Rose, MD  bisoprolol-hydrochlorothiazide [facility]) 5-6.25 MG tablet  08/21/18   [provider]  clindamycin (CLINDAGEL) 1 % gel Apply topically. 08/25/18   [provider]  hyoscyamine (LEVBID) 0.375 MG 12 hr tablet  08/05/18   [provider]  meloxicam (MOBIC) 15 MG tablet  07/29/18   [provider]  pantoprazole (PROTONIX) 40 MG tablet  01/05/19   [provider]  traZODone (DESYREL) 50 MG tablet TAKE 1-2 TABLETS AT BEDTIME AS NEEDED FOR SLEEP 04/18/17   [provider]    Allergies Patient has no known allergies.  History reviewed. No pertinent family history.  Social History Social History   Tobacco Use   Smoking status: Never   Smokeless tobacco: Never  Vaping Use   Vaping Use: Never used  Substance Use Topics   Alcohol use: Yes    Comment: social   Drug use: No    Review of Systems Constitutional: +fever/chills Eyes: No visual changes. ENT: No sore throat. Cardiovascular: Denies chest pain. Respiratory: Positive for cough and some shortness of breath. Gastrointestinal: Positive for occasional nausea and vomiting.  No abdominal pain. Genitourinary: Negative for dysuria. Musculoskeletal: Positive for generalized body aches.  Negative for neck pain.  Negative for back pain. Integumentary: Negative for rash. Neurological: Negative for headaches, focal weakness or numbness.   ____________________________________________   PHYSICAL EXAM:  VITAL SIGNS: ED Triage Vitals  Enc Vitals Group     BP 08/08/21 0358 (!) 149/70     Pulse Rate 08/08/21 0358 (!) 106     Resp 08/08/21 0358 (!) 24     Temp 08/08/21 0358 (!) 101.2 F (38.4 C)     Temp Source 08/08/21 0358 Oral     SpO2 08/08/21 0358 99 %     Weight 08/08/21 0354 127 kg (280 lb)     Height 08/08/21 0354 1.778 m (5\' 10" )     Head Circumference --      Peak  Flow --      Pain Score 08/08/21 0354 6     Pain Loc --      Pain Edu? --      Excl. in GC? --     Constitutional: Alert and oriented.  Appears uncomfortable but not in severe distress. Eyes: Conjunctivae are normal.  Head: Atraumatic. Nose: No congestion/rhinnorhea. Mouth/Throat: Patient is wearing a mask. Neck: No stridor.  No meningeal signs.   Cardiovascular: Tachycardia, regular rhythm. Good peripheral circulation. Respiratory: Normal respiratory effort.  No retractions.  Frequent  cough. Gastrointestinal: Soft and nontender. No distention.  Musculoskeletal: No lower extremity tenderness nor edema. No gross deformities of extremities. Neurologic:  Normal speech and language. No gross focal neurologic deficits are appreciated.  Skin:  Skin is warm, dry and intact. Psychiatric: Mood and affect are normal. Speech and behavior are normal.  ____________________________________________   LABS (all labs ordered are listed, but only abnormal results are displayed)  Labs Reviewed  RESP PANEL BY RT-PCR (FLU A&B, COVID) ARPGX2 - Abnormal; Notable for the following components:      Result Value   SARS Coronavirus 2 by RT PCR POSITIVE (*)    All other components within normal limits  COMPREHENSIVE METABOLIC PANEL - Abnormal; Notable for the following components:   Sodium 134 (*)    Glucose, Bld 119 (*)    All other components within normal limits  CBC WITH DIFFERENTIAL/PLATELET - Abnormal; Notable for the following components:   Hemoglobin 11.3 (*)    HCT 35.1 (*)    Lymphs Abs 0.2 (*)    All other components within normal limits  CULTURE, BLOOD (ROUTINE X 2)  CULTURE, BLOOD (ROUTINE X 2)  URINE CULTURE  LACTIC ACID, PLASMA  PROTIME-INR  APTT  URINALYSIS, COMPLETE (UACMP) WITH MICROSCOPIC  PREGNANCY, URINE   ____________________________________________  EKG  ED ECG REPORT I, Loleta Rose, the attending physician, personally viewed and interpreted this ECG.  Date: 08/08/2021 EKG Time: 4:19 AM Rate: 104 Rhythm: Sinus tachycardia QRS Axis: normal Intervals: normal ST/T Wave abnormalities: Non-specific ST segment / T-wave changes, but no clear evidence of acute ischemia. Narrative Interpretation: no definitive evidence of acute ischemia; does not meet STEMI criteria.  ____________________________________________  RADIOLOGY I, Loleta Rose, personally viewed and evaluated these images (plain radiographs) as part of my medical decision making, as well as  reviewing the written report by the radiologist.  ED MD interpretation: No evidence of acute abnormality on CXR  Official radiology report(s): DG Chest Port 1 View  Result Date: 08/08/2021 CLINICAL DATA:  Questionable sepsis. EXAM: PORTABLE CHEST 1 VIEW COMPARISON:  None. FINDINGS: Normal heart size and mediastinal contours. Low volume chest. No acute infiltrate or edema. No effusion or pneumothorax. No acute osseous findings. Low volume chest. IMPRESSION: Negative low volume chest. Electronically Signed   By: Tiburcio Pea M.D.   On: 08/08/2021 04:33    ____________________________________________   PROCEDURES   Procedure(s) performed (including Critical Care):  .1-3 Lead EKG Interpretation Performed by: Loleta Rose, MD Authorized by: Loleta Rose, MD     Interpretation: abnormal     ECG rate:  104   ECG rate assessment: tachycardic     Rhythm: sinus tachycardia     Ectopy: none     Conduction: normal     ____________________________________________   INITIAL IMPRESSION / MDM / ASSESSMENT AND PLAN / ED COURSE  As part of my medical decision making, I reviewed the following data within the electronic MEDICAL RECORD NUMBER Nursing notes reviewed  and incorporated, Labs reviewed , EKG interpreted , Old chart reviewed, Radiograph reviewed , and Notes from prior ED visits and reviewed [place] Controlled Substance Database.   Differential diagnosis includes, but is not limited to, COVID-19, influenza, other nonspecific respiratory virus, pneumonia, urinary tract infection, intra-abdominal infection, sepsis.  The patient is on the cardiac monitor to evaluate for evidence of arrhythmia and/or significant heart rate changes.    Vital signs are stable and in fact the patient is a little bit hypertensive.  She is febrile to 101.2 and has a elevated heart rate between 100-110.  She is also tachypneic and vomiting.  She meets SIRS criteria and I am initiating a "possible sepsis" work-up.  EKG  is reassuring and shows no sign of ischemia and only mild tachycardia.  Ordering ibuprofen 600 mg by mouth and Zofran 4 mg IV as well as 1 L LR IV bolus.  Clinical Course as of 08/08/21 0749  Fri Aug 08, 2021  0500 DG Chest [place] [address] I personally reviewed the patient's imaging and agree with the radiologist's interpretation that there is no evidence of acute abnormality on chest x-ray. [CF]  0505 CBC WITH DIFFERENTIAL(!) CBC essentially normal with no significant anemia and no leukocytosis. [CF]  0505 Comprehensive metabolic panel(!) Comprehensive metabolic panel is also essentially within normal limits with only very mild hyponatremia. [CF]  0505 Lactic Acid, Venous: 1.9 Lactic acid is within normal limits. [CF]  0600 Urinalysis, Complete w Microscopic Urine, Clean Catch Urinalysis shows no evidence of infection.  Urine pregnancy test is negative. [CF]  0726 SARS Coronavirus 2 by RT PCR(!): POSITIVE COVID+ diagnosis consistent with symptoms.  Patient hemodynamically stable.  No more vomiting after droperidol.  Will d/c with Zofran and my usual/customary COVID recommendations and return precautions. [CF]    Clinical Course User Index [CF] Loleta Rose, MD     ____________________________________________  FINAL CLINICAL IMPRESSION(S) / ED DIAGNOSES  Final diagnoses:  COVID-19  Nausea and vomiting, unspecified vomiting type     MEDICATIONS GIVEN DURING THIS VISIT:  Medications  lactated ringers bolus 1,000 mL (0 mLs Intravenous Stopped 08/08/21 0644)  ibuprofen (ADVIL) tablet 600 mg (600 mg Oral Given 08/08/21 0448)  ondansetron (ZOFRAN) injection 4 mg (4 mg Intravenous Given 08/08/21 0427)  droperidol (INAPSINE) 2.5 MG/ML injection 2.5 mg (2.5 mg Intravenous Given 08/08/21 0732)  acetaminophen (TYLENOL) tablet 1,000 mg (1,000 mg Oral Given 08/08/21 0732)     ED Discharge Orders          Ordered    ondansetron (ZOFRAN-ODT) 4 MG disintegrating tablet        08/08/21  0737    chlorpheniramine-HYDROcodone (TUSSIONEX PENNKINETIC ER) 10-8 MG/5ML SUER  Every 12 hours PRN        08/08/21 0737             Note:  This document was prepared using Dragon voice recognition software and may include unintentional dictation errors.   Loleta Rose, MD 08/08/21 [phone]

## 2021-08-08 NOTE — ED Notes (Signed)
This RN in to check on patient, patient awake, alert, back to baseline verbal status.  EDP, Isaacs made aware.

## 2021-08-08 NOTE — ED Notes (Signed)
This RN received call from EDT, Amber, asked to come assess this patient due to possible allergic reaction. Upon arrival into room, EDP, Isaacs at bedside.  Patient appears to be having dystonic reaction to medication.  EDT, Amber reports patient was unable to speak w/ tongue hanging out of side of mouth upon arrival into room.  Patient attempting to speak but with difficulty, alert, responses delayed at this time.  See new order.

## 2021-08-08 NOTE — ED Provider Notes (Signed)
Called in to pt room due to subjective tongue changes, confusion after droperidol. Query possible dystonic reaction. Less likely true allergic rxn in absence of any other skin changes/rash, lip swelling, objective tongue swelling, wheezing, or other signs of allegic rxn. Benadryl given with good effect. In the event of possible allergy, will add Droperidol to list though suspect this could have been more dystonic/adverse rxn versus a true allergic rxn. Pt monitored and given additional fluids with no return of sx.  Pt had marked improvement with benadryl. No signs of ongoing allergic rxn or anaphylaxis. D/c with droperidol added to allergy/AE list.   Shaune Pollack, MD 08/08/21 [phone]

## 2021-08-08 NOTE — Discharge Instructions (Signed)
As we discussed, although you have tested positive for COVID-19 (coronavirus), you do not need to be hospitalized at this time.  Read through all the included information including the recommendations from the CDC.  We recommend that you self-quarantine at home with your immediate family only (people with whom you have already been in contact) for about a week after your fever has gone away (without taking medication to make your temperature come down, such as Tylenol (acetaminophen)), after your respiratory symptoms have improved.  You should have as minimal contact as possible with anyone else including close family as per the CDC paperwork guidelines listed below. Follow-up with your doctor by phone or online as needed and return immediately to the emergency department or call 911 only if you develop new or worsening symptoms that concern you.  If you were prescribed any medications, please use them as instructed.  If you were given information for the COVID-19 antibody infusion treatment clinic, please call them and leave your contact information.  This can be a very effective and important treatment method, and you should discuss with them if you qualify for treatment, though unfortunately the treatment is quite limited in supply, so please understand there will be times when this treatment is unavailable.  You can find up-to-date information about COVID-19 in [place] by calling the [place] Coronavirus Helpline: 1-866-462-3821. You may also call 2-1-1, or [phone], or additional resources.  You can also find information online at https://www.ncdhhs.gov/divisions/public-health/coronavirus-disease-2019-covid-19-response-[place], or on the Center for Disease Control (CDC) website at https://www.cdc.gov/coronavirus/2019-ncov/index.html.  

## 2021-08-08 NOTE — ED Triage Notes (Signed)
Patient ambulatory to triage with steady gait, without difficulty or distress noted; pt reports since Tuesday having fever, body aches, chills, nausea

## 2021-08-08 NOTE — ED Notes (Signed)
Pt had emesis times 1.

## 2021-08-09 LAB — URINE CULTURE

## 2021-08-13 LAB — CULTURE, BLOOD (ROUTINE X 2)
Culture: NO GROWTH
Culture: NO GROWTH

## 2021-10-22 ENCOUNTER — Other Ambulatory Visit: Payer: Self-pay | Admitting: Family Medicine

## 2021-10-22 DIAGNOSIS — M5416 Radiculopathy, lumbar region: Secondary | ICD-10-CM

## 2021-12-15 ENCOUNTER — Other Ambulatory Visit (INDEPENDENT_AMBULATORY_CARE_PROVIDER_SITE_OTHER): Payer: Self-pay | Admitting: Nurse Practitioner

## 2021-12-15 DIAGNOSIS — I83813 Varicose veins of bilateral lower extremities with pain: Secondary | ICD-10-CM

## 2021-12-16 ENCOUNTER — Ambulatory Visit (INDEPENDENT_AMBULATORY_CARE_PROVIDER_SITE_OTHER): Payer: BC Managed Care – PPO | Admitting: Vascular Surgery

## 2021-12-16 ENCOUNTER — Ambulatory Visit (INDEPENDENT_AMBULATORY_CARE_PROVIDER_SITE_OTHER): Payer: BC Managed Care – PPO

## 2021-12-16 ENCOUNTER — Encounter (INDEPENDENT_AMBULATORY_CARE_PROVIDER_SITE_OTHER): Payer: Self-pay | Admitting: Vascular Surgery

## 2021-12-16 VITALS — BP 137/82 | HR 60 | Resp 16 | Ht 70.0 in | Wt 276.0 lb

## 2021-12-16 DIAGNOSIS — I83811 Varicose veins of right lower extremities with pain: Secondary | ICD-10-CM

## 2021-12-16 DIAGNOSIS — I10 Essential (primary) hypertension: Secondary | ICD-10-CM | POA: Diagnosis not present

## 2021-12-16 DIAGNOSIS — I83813 Varicose veins of bilateral lower extremities with pain: Secondary | ICD-10-CM | POA: Diagnosis not present

## 2021-12-16 NOTE — Progress Notes (Signed)
? ? ?Patient ID: Jody Smith, female   DOB: 06/14/1986, 36 y.o.   MRN: [id number] ? ?Chief Complaint  ?Patient presents with  ? New Patient (Initial Visit)  ?  Ref [place] bilateral varicose veins with inflammation  ? ? ?HPI ?Jody Smith is a 36 y.o. female.  I am asked to see the patient by Dr. Judithann Sheen for evaluation of varicose veins of the right lower extremity which have now become painful.  She has had varicosities on the side for years, but for many years they were small and not painful.  She is now noticing tightness and discomfort overlying the varicosities particular in the right medial calf and knee area.  These have become bigger over time.  Her left leg has some varicosities that are only mildly bothersome at this point and not nearly as noticeable.  No previous history of DVT or superficial thrombophlebitis to her knowledge.  She does elevate her legs frequently and is very active.  Duplex today shows no DVT or superficial thrombophlebitis.  There is relatively long segment reflux in the right great saphenous vein.  There is reflux at the left saphenofemoral junction but that is it. ? ? ?Past Medical History:  ?Diagnosis Date  ? Hypertension   ? Obesity   ? ? ?Past Surgical History:  ?Procedure Laterality Date  ? TONSILLECTOMY    ? ? ? ?Family History  ?Problem Relation Age of Onset  ? Factor V Leiden deficiency Mother   ? Varicose Veins Mother   ? Hypertension Father   ? Factor V Leiden deficiency Maternal Aunt   ? Diabetes Paternal Grandmother   ? ? ? ? ?Social History  ? ?Tobacco Use  ? Smoking status: Never  ? Smokeless tobacco: Never  ?Vaping Use  ? Vaping Use: Never used  ?Substance Use Topics  ? Alcohol use: Yes  ?  Comment: social  ? Drug use: No  ? ? ? ?Allergies  ?Allergen Reactions  ? Droperidol Other (See Comments)  ?  Likely dystonic reaction, versus allergy with subjective tongue fullness - resolved with benadryl  ? ? ?Current Outpatient Medications  ?Medication Sig Dispense Refill  ?  bisoprolol-hydrochlorothiazide (ZIAC) 5-6.25 MG tablet     ? clindamycin (CLINDAGEL) 1 % gel Apply topically.    ? hyoscyamine (LEVBID) 0.375 MG 12 hr tablet     ? meloxicam (MOBIC) 15 MG tablet     ? ondansetron (ZOFRAN-ODT) 4 MG disintegrating tablet Allow 1-2 tablets to dissolve in your mouth every 8 hours as needed for nausea/vomiting 30 tablet 0  ? pantoprazole (PROTONIX) 40 MG tablet     ? traZODone (DESYREL) 50 MG tablet TAKE 1-2 TABLETS AT BEDTIME AS NEEDED FOR SLEEP    ? chlorpheniramine-HYDROcodone (TUSSIONEX PENNKINETIC ER) 10-8 MG/5ML SUER Take 5 mLs by mouth every 12 (twelve) hours as needed for cough. (Patient not taking: Reported on 12/16/2021) 115 mL 0  ? ?No current facility-administered medications for this visit.  ? ? ? ? ?REVIEW OF SYSTEMS (Negative unless checked) ? ?Constitutional: [] Weight loss  [] Fever  [] Chills ?Cardiac: [] Chest pain   [] Chest pressure   [] Palpitations   [] Shortness of breath when laying flat   [] Shortness of breath at rest   [] Shortness of breath with exertion. ?Vascular:  [] Pain in legs with walking   [x] Pain in legs at rest   [] Pain in legs when laying flat   [] Claudication   [] Pain in feet when walking  [] Pain in feet at rest  [] Pain  in feet when laying flat   [] History of DVT   [] Phlebitis   [] Swelling in legs   [x] Varicose veins   [] Non-healing ulcers ?Pulmonary:   [] Uses home oxygen   [] Productive cough   [] Hemoptysis   [] Wheeze  [] COPD   [] Asthma ?Neurologic:  [] Dizziness  [] Blackouts   [] Seizures   [] History of stroke   [] History of TIA  [] Aphasia   [] Temporary blindness   [] Dysphagia   [] Weakness or numbness in arms   [] Weakness or numbness in legs ?Musculoskeletal:  [] Arthritis   [] Joint swelling   [] Joint pain   [] Low back pain ?Hematologic:  [] Easy bruising  [] Easy bleeding   [] Hypercoagulable state   [] Anemic  [] Hepatitis ?Gastrointestinal:  [] Blood in stool   [] Vomiting blood  [] Gastroesophageal reflux/heartburn   [] Abdominal pain ?Genitourinary:  [] Chronic  kidney disease   [] Difficult urination  [] Frequent urination  [] Burning with urination   [] Hematuria ?Skin:  [] Rashes   [] Ulcers   [] Wounds ?Psychological:  [] History of anxiety   []  History of major depression. ? ? ? ?Physical Exam ?BP 137/82 (BP Location: Right Arm)   Pulse 60   Resp 16   Ht 5\' 10"  (1.778 m)   Wt 276 lb (125.2 kg)   BMI 39.60 kg/m?  ?Gen:  WD/WN, NAD ?Head: [place]/AT, No temporalis wasting.  ?Ear/Nose/Throat: Hearing grossly intact, nares w/o erythema or drainage, oropharynx w/o Erythema/Exudate ?Eyes: Conjunctiva clear, sclera non-icteric  ?Neck: trachea midline.  No JVD.  ?Pulmonary:  Good air movement, respirations not labored, no use of accessory muscles  ?Cardiac: RRR, no JVD ?Vascular: Diffuse varicosities in the right medial lower thigh and calf area with some larger more prominent bulbous varicosities in some areas.  On the left, the varicosities are more scattered and superficial appearing ?Vessel Right Left  ?Radial Palpable Palpable  ?    ?    ?    ?    ?    ?    ?    ?    ? ?Gastrointestinal:. No masses, surgical incisions, or scars. ?Musculoskeletal: M/S 5/5 throughout.  Extremities without ischemic changes.  No deformity or atrophy.  No edema. ?Neurologic: Sensation grossly intact in extremities.  Symmetrical.  Speech is fluent. Motor exam as listed above. ?Psychiatric: Judgment intact, Mood & affect appropriate for pt's clinical situation. ?Dermatologic: No rashes or ulcers noted.  No cellulitis or open wounds. ? ? ? ?Radiology ?No results found. ? ?Labs ?No results found for this or any previous visit (from the past 2160 hour(s)). ? ?Assessment/Plan: ? ?HTN, goal below 140/80 ?blood pressure control important in reducing the progression of atherosclerotic disease. On appropriate oral medications. ? ? ?Varicose veins of leg with pain, right ?Duplex today shows no DVT or superficial thrombophlebitis.  There is relatively long segment reflux in the right great saphenous vein.  There  is reflux at the left saphenofemoral junction but that is it.  Her right side is the more symptomatic side.  I have prescribed her 20 to 30 mmHg compression socks and recommend she wear those daily.  I have given her instructions to elevate her legs and remain active.  Laser ablation can be considered for treatment of her symptomatic varicose veins of the right lower extremity at any point going forward. ? ? ? ? ? ? ?12/16/2021, 2:57 PM ? ? ?This note was created with Dragon medical transcription system.  Any errors from dictation are unintentional.    ?

## 2021-12-16 NOTE — Assessment & Plan Note (Signed)
Duplex today shows no DVT or superficial thrombophlebitis.  There is relatively long segment reflux in the right great saphenous vein.  There is reflux at the left saphenofemoral junction but that is it.  Her right side is the more symptomatic side.  I have prescribed her 20 to 30 mmHg compression socks and recommend she wear those daily.  I have given her instructions to elevate her legs and remain active.  Laser ablation can be considered for treatment of her symptomatic varicose veins of the right lower extremity at any point going forward. ?

## 2021-12-16 NOTE — Assessment & Plan Note (Signed)
blood pressure control important in reducing the progression of atherosclerotic disease. On appropriate oral medications.  

## 2022-02-10 ENCOUNTER — Other Ambulatory Visit (INDEPENDENT_AMBULATORY_CARE_PROVIDER_SITE_OTHER): Payer: BC Managed Care – PPO | Admitting: Vascular Surgery

## 2022-02-17 ENCOUNTER — Encounter (INDEPENDENT_AMBULATORY_CARE_PROVIDER_SITE_OTHER): Payer: Self-pay | Admitting: Vascular Surgery

## 2022-02-17 ENCOUNTER — Encounter (INDEPENDENT_AMBULATORY_CARE_PROVIDER_SITE_OTHER): Payer: BC Managed Care – PPO

## 2022-02-17 ENCOUNTER — Ambulatory Visit (INDEPENDENT_AMBULATORY_CARE_PROVIDER_SITE_OTHER): Payer: BC Managed Care – PPO | Admitting: Vascular Surgery

## 2022-02-17 VITALS — BP 151/88 | HR 66 | Resp 16 | Wt 293.0 lb

## 2022-02-17 DIAGNOSIS — I83811 Varicose veins of right lower extremities with pain: Secondary | ICD-10-CM | POA: Diagnosis not present

## 2022-02-26 ENCOUNTER — Other Ambulatory Visit (INDEPENDENT_AMBULATORY_CARE_PROVIDER_SITE_OTHER): Payer: Self-pay | Admitting: Vascular Surgery

## 2022-02-26 DIAGNOSIS — I83811 Varicose veins of right lower extremities with pain: Secondary | ICD-10-CM

## 2022-02-27 ENCOUNTER — Ambulatory Visit (INDEPENDENT_AMBULATORY_CARE_PROVIDER_SITE_OTHER): Payer: BC Managed Care – PPO

## 2022-02-27 DIAGNOSIS — I83811 Varicose veins of right lower extremities with pain: Secondary | ICD-10-CM | POA: Diagnosis not present

## 2022-03-17 ENCOUNTER — Encounter (INDEPENDENT_AMBULATORY_CARE_PROVIDER_SITE_OTHER): Payer: Self-pay | Admitting: Nurse Practitioner

## 2022-03-17 ENCOUNTER — Ambulatory Visit (INDEPENDENT_AMBULATORY_CARE_PROVIDER_SITE_OTHER): Payer: BC Managed Care – PPO | Admitting: Nurse Practitioner

## 2022-03-17 VITALS — BP 143/81 | HR 67 | Resp 16 | Wt 286.0 lb

## 2022-03-17 DIAGNOSIS — I10 Essential (primary) hypertension: Secondary | ICD-10-CM

## 2022-03-17 DIAGNOSIS — I83811 Varicose veins of right lower extremities with pain: Secondary | ICD-10-CM

## 2022-03-17 NOTE — Progress Notes (Signed)
Subjective:    Patient ID: Jody Smith, female    DOB: Dec 03, 1985, 36 y.o.   MRN: [id number] Chief Complaint  Patient presents with   Follow-up    4 week post laser follow up    The patient returns to the office for followup status post laser ablation of the right great saphenous vein on right. The patient notes multiple residual varicosities bilaterally which continued to be uncomfortable dependent positions and remained tender to palpation. The patient's swelling is improved from preoperative status.  Overall the pain is greatly improved.    The patient is otherwise done well and there have been no complications related to the laser procedure or interval changes in the patient's overall   Venous ultrasound post laser shows successful laser ablation of the right, no DVT identified.     Review of Systems  Cardiovascular:  Negative for leg swelling.  All other systems reviewed and are negative.      Objective:   Physical Exam Vitals reviewed.  HENT:     Head: Normocephalic.  Cardiovascular:     Rate and Rhythm: Normal rate.  Pulmonary:     Effort: Pulmonary effort is normal.  Skin:    General: Skin is warm and dry.  Neurological:     Mental Status: She is alert and oriented to person, place, and time.  Psychiatric:        Mood and Affect: Mood normal.        Behavior: Behavior normal.        Thought Content: Thought content normal.        Judgment: Judgment normal.     BP (!) 143/81 (BP Location: Left Arm)   Pulse 67   Resp 16   Wt 286 lb (129.7 kg)   BMI 41.04 kg/m   Past Medical History:  Diagnosis Date   Hypertension    Obesity     Social History   Socioeconomic History   Marital status: Married    Spouse name: Not on file   Number of children: Not on file   Years of education: Not on file   Highest education level: Not on file  Occupational History   Not on file  Tobacco Use   Smoking status: Never   Smokeless tobacco: Never  Vaping Use    Vaping Use: Never used  Substance and Sexual Activity   Alcohol use: Yes    Comment: social   Drug use: No   Sexual activity: Yes    Partners: Male    Birth control/protection: Implant  Other Topics Concern   Not on file  Social History Narrative   Not on file   Social Determinants of Health   Financial Resource Strain: Not on file  Food Insecurity: Not on file  Transportation Needs: Not on file  Physical Activity: Not on file  Stress: Not on file  Social Connections: Not on file  Intimate Partner Violence: Not on file    Past Surgical History:  Procedure Laterality Date   TONSILLECTOMY      Family History  Problem Relation Age of Onset   Factor V Leiden deficiency Mother    Varicose Veins Mother    Hypertension Father    Factor V Leiden deficiency Maternal Aunt    Diabetes Paternal Grandmother     Allergies  Allergen Reactions   Droperidol Other (See Comments)    Likely dystonic reaction, versus allergy with subjective tongue fullness - resolved with benadryl  Latest Ref Rng & Units 08/08/2021    4:26 AM 10/12/2018   12:42 PM  CBC  WBC 4.0 - 10.5 K/uL 5.9  8.8   Hemoglobin 12.0 - 15.0 g/dL 81.0  17.5   Hematocrit 36.0 - 46.0 % 35.1  37.9   Platelets 150 - 400 K/uL 219  306       CMP     Component Value Date/Time   NA 134 (L) 08/08/2021 0426   K 3.7 08/08/2021 0426   CL 102 08/08/2021 0426   CO2 26 08/08/2021 0426   GLUCOSE 119 (H) 08/08/2021 0426   BUN 12 08/08/2021 0426   CREATININE 0.90 08/08/2021 0426   CALCIUM 8.9 08/08/2021 0426   PROT 7.4 08/08/2021 0426   ALBUMIN 4.1 08/08/2021 0426   AST 22 08/08/2021 0426   ALT 18 08/08/2021 0426   ALKPHOS 53 08/08/2021 0426   BILITOT 0.3 08/08/2021 0426   GFRNONAA >60 08/08/2021 0426   GFRAA >60 10/12/2018 1242     No results found.     Assessment & Plan:   1. Varicose veins of leg with pain, right Recommend:  The patient has had successful ablation of the previously incompetent  saphenous venous system but some residual varicosities that are causing discomfort.  Patient should undergo injection sclerotherapy to treat the residual varicosities.  The risks, benefits and alternative therapies were reviewed in detail with the patient.  All questions were answered.  The patient agrees to proceed with sclerotherapy at their convenience.  The patient will continue wearing the graduated compression stockings and using the over-the-counter pain medications to treat her symptoms.       2. HTN, goal below 140/80 Continue antihypertensive medications as already ordered, these medications have been reviewed and there are no changes at this time.    Current Outpatient Medications on File Prior to Visit  Medication Sig Dispense Refill   bisoprolol-hydrochlorothiazide (ZIAC) 5-6.25 MG tablet      clindamycin (CLINDAGEL) 1 % gel Apply topically.     hyoscyamine (LEVBID) 0.375 MG 12 hr tablet      meloxicam (MOBIC) 15 MG tablet      ondansetron (ZOFRAN-ODT) 4 MG disintegrating tablet Allow 1-2 tablets to dissolve in your mouth every 8 hours as needed for nausea/vomiting 30 tablet 0   pantoprazole (PROTONIX) 40 MG tablet      traZODone (DESYREL) 50 MG tablet TAKE 1-2 TABLETS AT BEDTIME AS NEEDED FOR SLEEP     chlorpheniramine-HYDROcodone (TUSSIONEX PENNKINETIC ER) 10-8 MG/5ML SUER Take 5 mLs by mouth every 12 (twelve) hours as needed for cough. (Patient not taking: Reported on 12/16/2021) 115 mL 0   No current facility-administered medications on file prior to visit.    There are no Patient Instructions on file for this visit. No follow-ups on file.   Georgiana Spinner, NP

## 2022-04-29 ENCOUNTER — Telehealth (INDEPENDENT_AMBULATORY_CARE_PROVIDER_SITE_OTHER): Payer: Self-pay | Admitting: Nurse Practitioner

## 2022-04-29 NOTE — Telephone Encounter (Signed)
LVM for pt TCB and schedule SALINE sclero appts with Sheppard Plumber, NP.  right leg SALINE sclero. no auth req. see FB X 3

## 2022-05-18 ENCOUNTER — Encounter (INDEPENDENT_AMBULATORY_CARE_PROVIDER_SITE_OTHER): Payer: Self-pay | Admitting: Nurse Practitioner

## 2022-05-18 ENCOUNTER — Ambulatory Visit (INDEPENDENT_AMBULATORY_CARE_PROVIDER_SITE_OTHER): Payer: BC Managed Care – PPO | Admitting: Nurse Practitioner

## 2022-05-18 VITALS — BP 135/90 | HR 71 | Resp 17 | Ht 70.0 in | Wt 288.0 lb

## 2022-05-18 DIAGNOSIS — I83811 Varicose veins of right lower extremities with pain: Secondary | ICD-10-CM | POA: Diagnosis not present

## 2022-05-18 NOTE — Progress Notes (Signed)
Varicose veins of right  lower extremity with inflammation (454.1  I83.10) Current Plans   Indication: Patient presents with symptomatic varicose veins of the right  lower extremity.   Procedure: Sclerotherapy using hypertonic saline mixed with 1% Lidocaine was performed on the right lower extremity. Compression wraps were placed. The patient tolerated the procedure well. 

## 2022-06-22 ENCOUNTER — Ambulatory Visit (INDEPENDENT_AMBULATORY_CARE_PROVIDER_SITE_OTHER): Payer: BC Managed Care – PPO | Admitting: Nurse Practitioner

## 2022-06-22 ENCOUNTER — Encounter (INDEPENDENT_AMBULATORY_CARE_PROVIDER_SITE_OTHER): Payer: Self-pay

## 2022-06-22 ENCOUNTER — Encounter (INDEPENDENT_AMBULATORY_CARE_PROVIDER_SITE_OTHER): Payer: Self-pay | Admitting: Nurse Practitioner

## 2022-06-22 VITALS — BP 161/98 | HR 63 | Resp 18 | Ht 70.0 in | Wt 283.4 lb

## 2022-06-22 DIAGNOSIS — I83811 Varicose veins of right lower extremities with pain: Secondary | ICD-10-CM

## 2022-06-22 NOTE — Progress Notes (Signed)
Varicose veins of right  lower extremity with inflammation (454.1  I83.10) Current Plans   Indication: Patient presents with symptomatic varicose veins of the right  lower extremity.   Procedure: Sclerotherapy using hypertonic saline mixed with 1% Lidocaine was performed on the right lower extremity. Compression wraps were placed. The patient tolerated the procedure well. 

## 2022-07-21 ENCOUNTER — Ambulatory Visit (INDEPENDENT_AMBULATORY_CARE_PROVIDER_SITE_OTHER): Payer: BC Managed Care – PPO | Admitting: Nurse Practitioner

## 2022-07-31 ENCOUNTER — Encounter (INDEPENDENT_AMBULATORY_CARE_PROVIDER_SITE_OTHER): Payer: Self-pay | Admitting: Nurse Practitioner

## 2022-07-31 ENCOUNTER — Ambulatory Visit (INDEPENDENT_AMBULATORY_CARE_PROVIDER_SITE_OTHER): Payer: BC Managed Care – PPO | Admitting: Nurse Practitioner

## 2022-07-31 VITALS — BP 118/81 | HR 69 | Ht 70.0 in | Wt 276.0 lb

## 2022-07-31 DIAGNOSIS — I83811 Varicose veins of right lower extremities with pain: Secondary | ICD-10-CM | POA: Diagnosis not present

## 2022-08-10 ENCOUNTER — Ambulatory Visit (INDEPENDENT_AMBULATORY_CARE_PROVIDER_SITE_OTHER): Payer: BC Managed Care – PPO | Admitting: Nurse Practitioner

## 2022-08-18 ENCOUNTER — Encounter (INDEPENDENT_AMBULATORY_CARE_PROVIDER_SITE_OTHER): Payer: Self-pay | Admitting: Nurse Practitioner

## 2022-08-18 NOTE — Progress Notes (Signed)
Varicose veins of right  lower extremity with inflammation (454.1  I83.10) Current Plans   Indication: Patient presents with symptomatic varicose veins of the right  lower extremity.   Procedure: Sclerotherapy using hypertonic saline mixed with 1% Lidocaine was performed on the right lower extremity. Compression wraps were placed. The patient tolerated the procedure well. 

## 2022-08-28 ENCOUNTER — Ambulatory Visit (INDEPENDENT_AMBULATORY_CARE_PROVIDER_SITE_OTHER): Payer: BC Managed Care – PPO | Admitting: Nurse Practitioner

## 2023-01-21 IMAGING — DX DG CHEST 1V PORT
1 series · 1 of 1 positions shown · non-contrast
Comparison: None.

CLINICAL DATA: Questionable sepsis.

EXAM:
PORTABLE CHEST 1 VIEW

[chest ap]
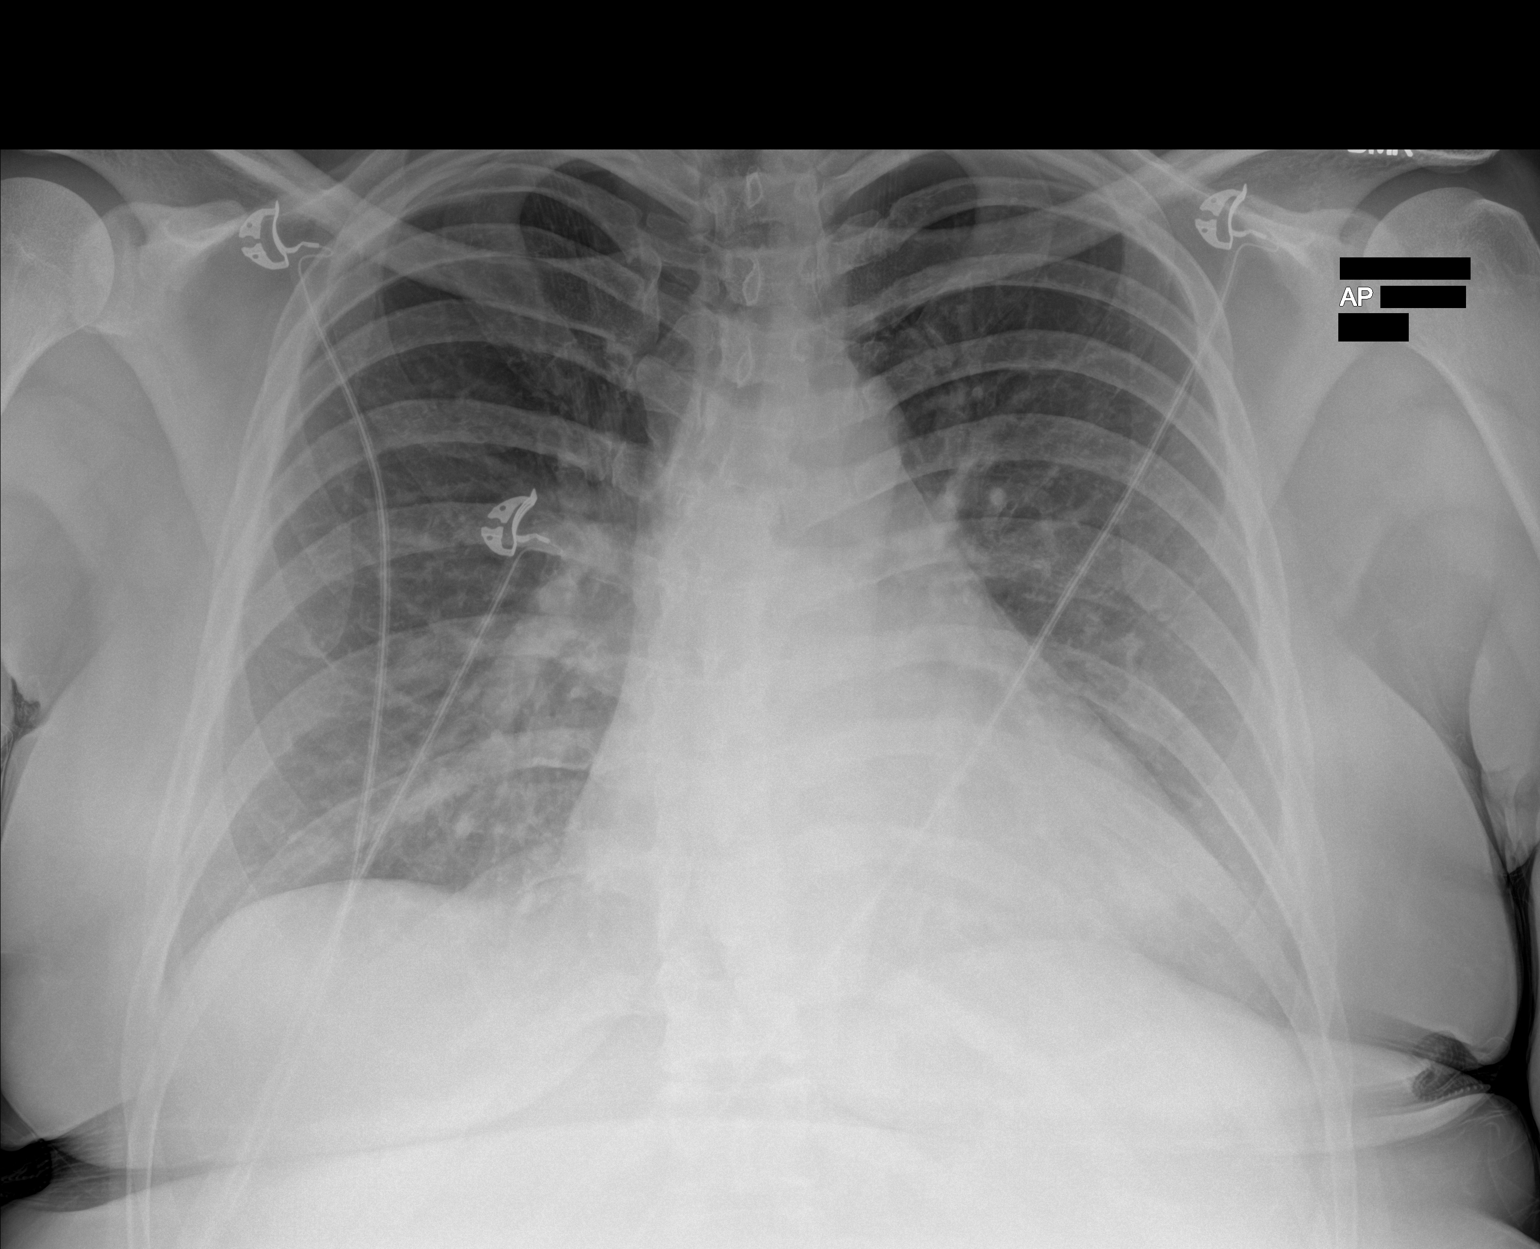

[1 of 1 positions shown; findings below may reference images not displayed]

FINDINGS: Normal heart size and mediastinal contours. Low volume chest. No
acute infiltrate or edema. No effusion or pneumothorax. No acute
osseous findings.

Low volume chest.
IMPRESSION: Negative low volume chest.

## 2024-09-19 ENCOUNTER — Other Ambulatory Visit: Payer: Self-pay | Admitting: Internal Medicine

## 2024-09-19 DIAGNOSIS — Z1231 Encounter for screening mammogram for malignant neoplasm of breast: Secondary | ICD-10-CM

## 2024-10-04 ENCOUNTER — Ambulatory Visit: Admitting: Podiatry
